# Patient Record
Sex: Female | Born: 1945
Health system: Southern US, Community
[De-identification: ages and names within clinical notes are randomized; demographics above are authoritative.]

## PROBLEM LIST (undated history)

## (undated) DIAGNOSIS — H269 Unspecified cataract: Secondary | ICD-10-CM

## (undated) DIAGNOSIS — T7840XA Allergy, unspecified, initial encounter: Secondary | ICD-10-CM

## (undated) DIAGNOSIS — K5730 Diverticulosis of large intestine without perforation or abscess without bleeding: Secondary | ICD-10-CM

## (undated) DIAGNOSIS — L309 Dermatitis, unspecified: Secondary | ICD-10-CM

## (undated) DIAGNOSIS — E739 Lactose intolerance, unspecified: Secondary | ICD-10-CM

## (undated) DIAGNOSIS — D649 Anemia, unspecified: Secondary | ICD-10-CM

## (undated) DIAGNOSIS — I10 Essential (primary) hypertension: Secondary | ICD-10-CM

## (undated) DIAGNOSIS — J189 Pneumonia, unspecified organism: Secondary | ICD-10-CM

## (undated) DIAGNOSIS — M1990 Unspecified osteoarthritis, unspecified site: Secondary | ICD-10-CM

## (undated) HISTORY — PX: VAGINAL HYSTERECTOMY: SUR661

## (undated) HISTORY — DX: Unspecified cataract: H26.9

## (undated) HISTORY — DX: Dermatitis, unspecified: L30.9

## (undated) HISTORY — DX: Allergy, unspecified, initial encounter: T78.40XA

## (undated) HISTORY — DX: Unspecified osteoarthritis, unspecified site: M19.90

## (undated) HISTORY — PX: COLONOSCOPY: SHX174

## (undated) HISTORY — DX: Diverticulosis of large intestine without perforation or abscess without bleeding: K57.30

## (undated) HISTORY — DX: Lactose intolerance, unspecified: E73.9

## (undated) HISTORY — PX: CARPAL TUNNEL RELEASE: SHX101

## (undated) HISTORY — PX: OTHER SURGICAL HISTORY: SHX169

---

## 2003-02-04 DIAGNOSIS — K5730 Diverticulosis of large intestine without perforation or abscess without bleeding: Secondary | ICD-10-CM

## 2003-02-04 HISTORY — DX: Diverticulosis of large intestine without perforation or abscess without bleeding: K57.30

## 2007-12-01 LAB — HM PAP SMEAR: HM Pap smear: NORMAL

## 2007-12-23 LAB — FECAL OCCULT BLOOD, GUAIAC: FECAL OCCULT BLD: NEGATIVE

## 2010-07-20 ENCOUNTER — Emergency Department (HOSPITAL_COMMUNITY)
Admission: EM | Admit: 2010-07-20 | Discharge: 2010-07-21 | Disposition: A | Discharge status: Home / Self Care | Payer: Medicare Other | Attending: Emergency Medicine | Admitting: Emergency Medicine

## 2010-07-20 DIAGNOSIS — M549 Dorsalgia, unspecified: Secondary | ICD-10-CM | POA: Insufficient documentation

## 2010-07-20 DIAGNOSIS — H3320 Serous retinal detachment, unspecified eye: Secondary | ICD-10-CM | POA: Insufficient documentation

## 2010-07-20 DIAGNOSIS — R079 Chest pain, unspecified: Secondary | ICD-10-CM | POA: Insufficient documentation

## 2010-07-20 DIAGNOSIS — R51 Headache: Secondary | ICD-10-CM | POA: Insufficient documentation

## 2010-07-20 DIAGNOSIS — H43399 Other vitreous opacities, unspecified eye: Secondary | ICD-10-CM | POA: Insufficient documentation

## 2010-07-20 DIAGNOSIS — R509 Fever, unspecified: Secondary | ICD-10-CM | POA: Insufficient documentation

## 2010-07-20 LAB — CBC
MCHC: 33.3 g/dL (ref 30.0–36.0)
Platelets: 182 10*3/uL (ref 150–400)
RDW: 14.6 % (ref 11.5–15.5)
WBC: 7.8 10*3/uL (ref 4.0–10.5)

## 2010-07-20 LAB — DIFFERENTIAL
Basophils Absolute: 0 10*3/uL (ref 0.0–0.1)
Basophils Relative: 0 % (ref 0–1)
Eosinophils Absolute: 0 10*3/uL (ref 0.0–0.7)
Eosinophils Relative: 0 % (ref 0–5)
Lymphocytes Relative: 26 % (ref 12–46)
Monocytes Absolute: 0.5 10*3/uL (ref 0.1–1.0)

## 2010-07-23 ENCOUNTER — Emergency Department (HOSPITAL_COMMUNITY)
Admission: EM | Admit: 2010-07-23 | Discharge: 2010-07-23 | Disposition: A | Discharge status: Home / Self Care | Payer: Medicare Other | Attending: Emergency Medicine | Admitting: Emergency Medicine

## 2010-07-23 DIAGNOSIS — E860 Dehydration: Secondary | ICD-10-CM | POA: Insufficient documentation

## 2010-07-23 DIAGNOSIS — R112 Nausea with vomiting, unspecified: Secondary | ICD-10-CM | POA: Insufficient documentation

## 2010-07-23 DIAGNOSIS — R51 Headache: Secondary | ICD-10-CM | POA: Insufficient documentation

## 2010-07-23 LAB — URINALYSIS, ROUTINE W REFLEX MICROSCOPIC
Bilirubin Urine: NEGATIVE
Hgb urine dipstick: NEGATIVE
Leukocytes, UA: NEGATIVE
Nitrite: NEGATIVE
Nitrite: NEGATIVE
Protein, ur: NEGATIVE mg/dL
Specific Gravity, Urine: 1.017 (ref 1.005–1.030)
Specific Gravity, Urine: 1.012 (ref 1.005–1.030)
Urobilinogen, UA: 1 mg/dL (ref 0.0–1.0)
Urobilinogen, UA: 0.2 mg/dL (ref 0.0–1.0)
pH: 6.5 (ref 5.0–8.0)

## 2010-07-23 LAB — POCT I-STAT, CHEM 8
Chloride: 102 mEq/L (ref 96–112)
HCT: 38 % (ref 36.0–46.0)
Hemoglobin: 12.9 g/dL (ref 12.0–15.0)
Potassium: 3.7 mEq/L (ref 3.5–5.1)
Sodium: 137 mEq/L (ref 135–145)

## 2010-07-24 LAB — B. BURGDORFI ANTIBODIES: B burgdorferi Ab IgG+IgM: 0.18 {ISR}

## 2010-07-24 LAB — ROCKY MTN SPOTTED FVR AB, IGM-BLOOD: RMSF IgM: 0.44 IV (ref 0.00–0.89)

## 2010-08-01 ENCOUNTER — Other Ambulatory Visit: Payer: Self-pay | Admitting: Internal Medicine

## 2010-08-01 ENCOUNTER — Other Ambulatory Visit: Payer: Self-pay | Admitting: Nurse Practitioner

## 2010-08-01 DIAGNOSIS — N63 Unspecified lump in unspecified breast: Secondary | ICD-10-CM

## 2010-08-09 ENCOUNTER — Ambulatory Visit
Admission: RE | Admit: 2010-08-09 | Discharge: 2010-08-09 | Disposition: A | Discharge status: Home / Self Care | Payer: Medicare Other | Source: Ambulatory Visit | Attending: Internal Medicine | Admitting: Internal Medicine

## 2010-08-09 DIAGNOSIS — N63 Unspecified lump in unspecified breast: Secondary | ICD-10-CM

## 2011-02-05 DIAGNOSIS — N760 Acute vaginitis: Secondary | ICD-10-CM | POA: Diagnosis not present

## 2011-06-16 ENCOUNTER — Emergency Department (INDEPENDENT_AMBULATORY_CARE_PROVIDER_SITE_OTHER): Payer: Medicare Other

## 2011-06-16 ENCOUNTER — Encounter (HOSPITAL_COMMUNITY): Payer: Self-pay | Admitting: Emergency Medicine

## 2011-06-16 ENCOUNTER — Emergency Department (INDEPENDENT_AMBULATORY_CARE_PROVIDER_SITE_OTHER)
Admission: EM | Admit: 2011-06-16 | Discharge: 2011-06-16 | Disposition: A | Discharge status: Home / Self Care | Payer: Medicare Other | Source: Home / Self Care | Attending: Family Medicine | Admitting: Family Medicine

## 2011-06-16 DIAGNOSIS — M542 Cervicalgia: Secondary | ICD-10-CM | POA: Diagnosis not present

## 2011-06-16 DIAGNOSIS — R209 Unspecified disturbances of skin sensation: Secondary | ICD-10-CM | POA: Diagnosis not present

## 2011-06-16 DIAGNOSIS — M5030 Other cervical disc degeneration, unspecified cervical region: Secondary | ICD-10-CM | POA: Diagnosis not present

## 2011-06-16 NOTE — ED Provider Notes (Signed)
History     CSN: [id number]  Arrival date & time 06/16/11  1520   First MD Initiated Contact with Patient 06/16/11 1721      Chief Complaint  Patient presents with  . Numbness    (Consider location/radiation/quality/duration/timing/severity/associated sxs/prior treatment) Patient is a 66 y.o. female presenting with neck injury. The history is provided by the patient. No language interpreter was used.  Neck Injury This is a chronic problem. The current episode started more than 1 week ago. The problem occurs hourly. The problem has been gradually worsening. The symptoms are relieved by nothing. She has tried nothing for the symptoms.   Pt reports her right hand is numb and she is scheduled for hand surgery.  Pt reports she has pain in her neck.   Pt called MD who told her to come here.  Pt concerned her hand numbness is from her neck. History reviewed. No pertinent past medical history.  Past Surgical History  Procedure Date  . Abdominal hysterectomy     No family history on file.  History  Substance Use Topics  . Smoking status: Never Smoker   . Smokeless tobacco: Not on file  . Alcohol Use: No    OB History    Grav Para Term Preterm Abortions TAB SAB Ect Mult Living                  Review of Systems  Musculoskeletal: Positive for myalgias.  Neurological: Positive for numbness.  All other systems reviewed and are negative.    Allergies  Latex  Home Medications   Current Outpatient Rx  Name Route Sig Dispense Refill  . ESTRADIOL 0.5 MG PO TABS Oral Take 0.5 mg by mouth daily.      BP 145/72  Pulse 62  Temp(Src) 98.6 F (37 C) (Oral)  Resp 20  SpO2 99%  Physical Exam  Nursing note and vitals reviewed. Constitutional: She is oriented to person, place, and time. She appears well-developed and well-nourished.  HENT:  Head: Normocephalic and atraumatic.  Eyes: Pupils are equal, round, and reactive to light.  Neck: Normal range of motion. Neck supple.   Pulmonary/Chest: Effort normal.  Musculoskeletal:       Tender cervical spine,  Decreased range of motion    Neurological: She is alert and oriented to person, place, and time.  Skin: Skin is warm and dry.  Psychiatric: She has a normal mood and affect.    ED Course  Procedures (including critical care time)  Labs Reviewed - No data to display Dg Cervical Spine Complete  06/16/2011  *RADIOLOGY REPORT*  Clinical Data: Chronic neck pain and right arm/hand numbness for 1 year.  CERVICAL SPINE - COMPLETE 4+ VIEW  Comparison: None.  Findings: 2 mm of anterolisthesis of C4 on C5 is present.  There is degenerative disc space narrowing noted at the C4-5 and C5-6 levels.  There are no fractures.  There is  right C4-5 and ( to a lesser extent ) C5-6 bony foraminal narrowing present.  There are no destructive changes.  The C1-C2 articulation has a normal appearance.  IMPRESSION:  1.  2 mm of anterolisthesis of C4 on C5 most likely due to facet joint arthritic change. 2.  C4-5 and C5-6 degenerative disc space narrowing. 3.  C4-5 and C5-6 bony foraminal narrowing (greater at the C4-5 level).  Original Report Authenticated By: Rolla Plate, M.D.     No diagnosis found.    MDM  Pt advised to follow up  with her Md for recheck and schedule to see Neurosurgery for evaluation        Elson Areas, [place] 06/16/11 1805

## 2011-06-16 NOTE — Discharge Instructions (Signed)
Spondylolysis with Rehab Spondylolysis is a condition of the spine that is characterized by stress fracture of one or more vertebrae. These stress fractures occur in an area of the vertebrae between the facet joints (pons inter-articularis). Facet joints are what enable the spine to move and pivot.  SYMPTOMS   Dull, achy pain in the lower back. Pain that worsens with extension of the spine.   Tightness of the muscles on the back of the thigh.   Lower back stiffness.  CAUSES  Stress fractures occur when repetitive stress damages a bone faster than the bone can heal. Common mechanisms of injury for spondylolysis include:  Repetitive extension beyond normal limits of the spine (hyperextension).   Repetitive excessive rotation of the spine.   Direct trauma (uncommon).  RISK INCREASES WITH:  Activities that have a risk of hyper-extending the back.   Activities that have a risk of excessively rotating the spine.   Poor strength and flexibility   Failure to warm-up properly before activity.   Family history of spondylolysis or slippage or movement of one vertebra on another (spondylolisthesis).   Improper sports technique.  PREVENTION  Use proper technique.   Wear proper protective equipment and ensure correct fit.   Appropriately warm up and stretch before practice or competition.   Maintain appropriate conditioning:   Back and hamstring flexibility.   Back muscle strength and endurance.   Cardiovascular fitness.  PROGNOSIS  If treated properly, then spondylolysis usually heals within 6 weeks of non-surgical (conservative) treatment. RELATED COMPLICATIONS   Recurrent symptoms that result in a chronic problem.   Inability to compete in athletics.   Prolonged healing time, if improperly treated or re-injured.   Failure of the fracture to heal (nonunion)   Healing of the fracture in a poor position (malunion).   Progression to spondylolisthesis.  TREATMENT    Treatment initially involves resting from any activities that aggravate the symptoms, and the use of ice and medications to help reduce pain and inflammation. The use of strengthening and stretching exercises may help reduce pain with activity. These exercises may be performed at home or with referral to a therapist. It is important to learn how to use proper body mechanics so undue stress is not placed on your spine. If the injury is severe, then your caregiver may recommend a back brace to allow for healing or even surgery. Surgery often involves fusing two adjacent vertebrae, so no movement is allowed between them. Surgery is rarely performed for this condition; however, if symptoms persist for greater than 12 months despite non-surgical (conservative) treatment, then surgery may be recommended. MEDICATION   If pain medication is necessary, then nonsteroidal anti-inflammatory medications, such as aspirin and ibuprofen, or other minor pain relievers, such as acetaminophen, are often recommended.   Do not take pain medication for 7 days before surgery.   Prescription pain relievers may be given if deemed necessary by your caregiver. Use only as directed and only as much as you need.  HEAT AND COLD:  Cold treatment (icing) relieves pain and reduces inflammation. Cold treatment should be applied for 10 to 15 minutes every 2 to 3 hours for inflammation and pain and immediately after any activity that aggravates your symptoms. Use ice packs or massage the area with a piece of ice (ice massage).   Heat treatment may be used prior to performing the stretching and strengthening activities prescribed by your caregiver, physical therapist, or athletic trainer. Use a heat pack or soak your injury in   warm water.  SEEK MEDICAL CARE IF:  Treatment seems to offer no benefit, or the condition worsens.   Any medications produce adverse side effects.  EXERCISES RANGE OF MOTION (ROM) AND STRETCHING EXERCISES -  Spondylolysis Most people with low back pain will find that their symptoms worsen with either excessive bending forward (flexion) or arching at the low back (extension). The exercises which will help resolve your symptoms will focus on the opposite motion. Your physician, physical therapist or athletic trainer will help you determine which exercises will be most helpful to resolve your low back pain. Do not complete any exercises without first consulting with your clinician. Discontinue any exercises which worsen your symptoms until you speak to your clinician. You may have pain, numbness or tingling which travels down into your buttocks, leg or foot.  The goal of the therapy is to get these symptoms to recede to your back and eventually resolve. Occasionally, these leg symptoms will get better, but your low back pain may worsen; this is typically an indication of progress in your rehabilitation. Be certain to be very alert to any changes in your symptoms and the activities in which you participated in the 24 hours prior to the change. Sharing this information with your clinician will allow him/her to most efficiently treat your condition. These exercises may help you when beginning to rehabilitate your injury. Your symptoms may resolve with or without further involvement from your physician, physical therapist or athletic trainer. While completing these exercises, remember:   Restoring tissue flexibility helps normal motion to return to the joints. This allows healthier, less painful movement and activity.   An effective stretch should be held for at least 30 seconds.   A stretch should never be painful. You should only feel a gentle lengthening or release in the stretched tissue.  FLEXION RANGE OF MOTION AND STRETCHING EXERCISES: STRETCH - Flexion, Single Knee to Chest  Lie on a firm bed or the floor with both legs extended in front of you.   Keeping one leg in contact with the floor, bring your  opposite knee to your chest. Hold your leg in place by either grabbing behind your thigh or at your knee.   Pull until you feel a gentle stretch in your low back. Hold __________ seconds.   Slowly release your grasp and repeat the exercise with the opposite side.  Repeat __________ times. Complete this exercise __________ times per day.  STRETCH - Flexion, Double Knee to Chest  Lie on a firm bed or the floor with both legs extended in front of you.   Keeping one leg in contact with the floor, bring your opposite knee to your chest.   Tense your stomach muscles to support your back and then lift your other knee to your chest. Hold your legs in place by either grabbing behind your thighs or at your knees.   Pull both knees toward your chest until you feel a gentle stretch in your low back. Hold __________ seconds.   Tense your stomach muscles and slowly return one leg at a time to the floor.  Repeat __________ times. Complete this exercise __________ times per day.  STRENGTHENING EXERCISES - Spondylolysis These exercises may help you when beginning to rehabilitate your injury. These exercises should be done near your "sweet spot." This is the neutral, low-back arch, somewhere between fully rounded and fully arched, that is your least painful position. When performed in this safe range of motion, these exercises can be   used for people who have either a flexion or extension based injury. These exercises may resolve your symptoms with or without further involvement from your physician, physical therapist or athletic trainer. While completing these exercises, remember:   Muscles can gain both the endurance and the strength needed for everyday activities through controlled exercises.   Complete these exercises as instructed by your physician, physical therapist or athletic trainer. Progress with the resistance and repetition exercises only as your caregiver advises.   You may experience muscle  soreness or fatigue, but the pain or discomfort you are trying to eliminate should never worsen during these exercises. If this pain does worsen, stop and make certain you are following the directions exactly. If the pain is still present after adjustments, discontinue the exercise until you can discuss the trouble with your clinician.  STRENGTHENING - Deep Abdominals, Pelvic Tilt   Lie on a firm bed or the floor. Keeping your legs in front of you, bend your knees so they are both pointed toward the ceiling and your feet are flat on the floor.   Tense your lower abdominal muscles to press your low back into the floor. This motion will rotate your pelvis so that your tail bone is scooping upwards rather than pointing at your feet or into the floor.   With a gentle tension and even breathing, hold this position for __________ seconds.  Repeat __________ times. Complete this exercise __________ times per day.  STRENGTHENING - Abdominals, Crunches  Lie on a firm bed or the floor. Keeping your legs in front of you, bend your knees so they are both pointed toward the ceiling and your feet are flat on the floor. Cross your arms over your chest.   Slightly tip your chin down without bending your neck.   Tense your abdominals and slowly lift your trunk high enough to just clear your shoulder blades. Lifting higher can put excessive stress on the low back and does not further strengthen your abdominal muscles.   Control your return to the starting position.  Repeat __________ times. Complete this exercise __________ times per day.  STRENGTHENING - Quadruped, Opposite UE/LE Lift  Assume a hands and knees position on a firm surface. Keep your hands under your shoulders and your knees under your hips. You may place padding under your knees for comfort.   Find your neutral spine and gently tense your abdominal muscles so that you can maintain this position. Your shoulders and hips should form a rectangle  that is parallel with the floor and is not twisted.   Keeping your trunk steady, lift your right hand no higher than your shoulder and then your left leg no higher than your hip. Make sure you are not holding your breath. Hold this position __________ seconds.   Continuing to keep your abdominal muscles tense and your back steady, slowly return to your starting position. Repeat with the opposite arm and leg.  Repeat __________ times. Complete this exercise __________ times per day.  STRENGTHENING - Lower Abdominals, Double Knee Lift  Lie on a firm bed or the floor. Keeping your legs in front of you, bend your knees so they are both pointed toward the ceiling and your feet are flat on the floor.   Tense your abdominal muscles to brace your low back and slowly lift both of your knees until they come over your hips. Be certain not to hold your breath.   Hold __________ seconds. Using your abdominal muscles, return to   the starting position in a slow and controlled manner.  Repeat __________ times. Complete this exercise __________ times per day.  POSTURE AND BODY MECHANICS CONSIDERATIONS - Spondylolysis Keeping correct posture when sitting, standing or completing your activities will reduce the stress put on different body tissues, allowing injured tissues a chance to heal and limiting painful experiences. The following are general guidelines for improved posture. Your physician or physical therapist will provide you with any instructions specific to your needs. While reading these guidelines, remember:  The exercises prescribed by your provider will help you have the flexibility and strength to maintain correct postures.   The correct posture provides the optimal environment for your joints to work. All of your joints have less wear and tear when properly supported by a spine with good posture. This means you will experience a healthier, less painful body.   Correct posture must be practiced with  all of your activities, especially prolonged sitting and standing. Correct posture is as important when doing repetitive low-stress activities (typing) as it is when doing a single heavy-load activity (lifting).  PROPER SITTING POSTURE In order to minimize stress and discomfort on your spine, you must sit with correct posture. Sitting with good posture should be effortless for a healthy body. Returning to good posture is a gradual process. Many people can work toward this most comfortably by using various supports until they have the flexibility and strength to maintain this posture on their own. When sitting with proper posture, your ears will fall over your shoulders and your shoulders will fall over your hips. You should use the back of the chair to support your upper back. Your low back will be in a neutral position, just slightly arched. You may place a small pillow or folded towel at the base of your low back for support.  When working at a desk, create an environment that supports good, upright posture. Without extra support, muscles fatigue and lead to excessive strain on joints and other tissues. Keep these recommendations in mind: CHAIR:  A chair should be able to slide under your desk when your back makes contact with the back of the chair. This allows you to work closely.   The chair's height should allow your eyes to be level with the upper part of your monitor and your hands to be slightly lower than your elbows.  BODY POSITION  Your feet should make contact with the floor. If this is not possible, use a foot rest.   Keep your ears over your shoulders. This will reduce stress on your neck and low back.  INCORRECTSITTING POSTURES If you are feeling tired and unable to assume a healthy sitting posture, do not slouch or slump. This puts excessive strain on your back tissues, causing more damage and pain. Healthier options include:  Using more support, like a lumbar pillow.   Switching  tasks to something that requires you to be upright or walking.   Talking a brief walk.   Lying down to rest in a neutral-spine position.  CORRECT LIFTING TECHNIQUES DO :   Assume a wide stance. This will provide you more stability and the opportunity to get as close as possible to the object which you are lifting.   Tense your abdominals to brace your spine; then bend at the knees and hips. Keeping your back locked in a neutral-spine position, lift using your leg muscles. Lift with your legs, keeping your back straight.   Test the weight of unknown objects   before attempting to lift them.   Try to keep your elbows locked down at your sides in order get the best strength from your shoulders when carrying an object.   Always ask for help when lifting heavy or awkward objects.  INCORRECT LIFTING TECHNIQUES DO NOT:   Lock your knees when lifting, even if it is a small object.   Bend and twist. Pivot at your feet or move your feet when needing to change directions.   Assume that you cannot safely pick up a paperclip without proper posture.  Document Released: 01/20/2005 Document Revised: 01/09/2011 Document Reviewed: 05/04/2008 [employer] Patient Information 2012 [employer]. 

## 2011-06-16 NOTE — ED Notes (Signed)
Patient reports planning to have surgery on hand.  Patient has been told by a friend that this hand numbness may be her neck.  Patient here for neck xray.  Patient reports dr Mina Marble is hand specialist she is seeing.  Patient reports right hand numb.

## 2011-06-17 NOTE — ED Provider Notes (Signed)
Medical screening examination/treatment/procedure(s) were performed by non-physician practitioner and as supervising physician I was immediately available for consultation/collaboration.   MORENO-COLL,Anhelica Fowers; MD   Shalon Salado Moreno-Coll, MD 06/17/11 0115 

## 2011-06-19 DIAGNOSIS — M159 Polyosteoarthritis, unspecified: Secondary | ICD-10-CM | POA: Diagnosis not present

## 2011-06-19 DIAGNOSIS — G5600 Carpal tunnel syndrome, unspecified upper limb: Secondary | ICD-10-CM | POA: Diagnosis not present

## 2011-06-20 DIAGNOSIS — M5030 Other cervical disc degeneration, unspecified cervical region: Secondary | ICD-10-CM | POA: Diagnosis not present

## 2011-06-23 DIAGNOSIS — G5600 Carpal tunnel syndrome, unspecified upper limb: Secondary | ICD-10-CM | POA: Diagnosis not present

## 2011-06-23 DIAGNOSIS — M19049 Primary osteoarthritis, unspecified hand: Secondary | ICD-10-CM | POA: Diagnosis not present

## 2011-06-23 DIAGNOSIS — M654 Radial styloid tenosynovitis [de Quervain]: Secondary | ICD-10-CM | POA: Diagnosis not present

## 2011-06-24 ENCOUNTER — Other Ambulatory Visit: Payer: Self-pay | Admitting: Neurosurgery

## 2011-06-24 DIAGNOSIS — M47812 Spondylosis without myelopathy or radiculopathy, cervical region: Secondary | ICD-10-CM

## 2011-06-26 DIAGNOSIS — G5600 Carpal tunnel syndrome, unspecified upper limb: Secondary | ICD-10-CM | POA: Diagnosis not present

## 2011-06-26 DIAGNOSIS — M159 Polyosteoarthritis, unspecified: Secondary | ICD-10-CM | POA: Diagnosis not present

## 2011-06-27 DIAGNOSIS — W57XXXA Bitten or stung by nonvenomous insect and other nonvenomous arthropods, initial encounter: Secondary | ICD-10-CM | POA: Diagnosis not present

## 2011-07-03 ENCOUNTER — Ambulatory Visit
Admission: RE | Admit: 2011-07-03 | Discharge: 2011-07-03 | Disposition: A | Discharge status: Home / Self Care | Payer: Medicare Other | Source: Ambulatory Visit | Attending: Neurosurgery | Admitting: Neurosurgery

## 2011-07-03 DIAGNOSIS — M47812 Spondylosis without myelopathy or radiculopathy, cervical region: Secondary | ICD-10-CM

## 2011-07-03 DIAGNOSIS — M5030 Other cervical disc degeneration, unspecified cervical region: Secondary | ICD-10-CM | POA: Diagnosis not present

## 2011-07-10 DIAGNOSIS — G5600 Carpal tunnel syndrome, unspecified upper limb: Secondary | ICD-10-CM | POA: Diagnosis not present

## 2011-07-10 DIAGNOSIS — M19049 Primary osteoarthritis, unspecified hand: Secondary | ICD-10-CM | POA: Diagnosis not present

## 2011-07-17 ENCOUNTER — Other Ambulatory Visit: Payer: Self-pay | Admitting: Family Medicine

## 2011-07-17 DIAGNOSIS — M19049 Primary osteoarthritis, unspecified hand: Secondary | ICD-10-CM | POA: Diagnosis not present

## 2011-07-17 DIAGNOSIS — G5600 Carpal tunnel syndrome, unspecified upper limb: Secondary | ICD-10-CM | POA: Diagnosis not present

## 2011-07-17 DIAGNOSIS — Z1231 Encounter for screening mammogram for malignant neoplasm of breast: Secondary | ICD-10-CM

## 2011-07-24 DIAGNOSIS — M5030 Other cervical disc degeneration, unspecified cervical region: Secondary | ICD-10-CM | POA: Diagnosis not present

## 2011-08-12 ENCOUNTER — Ambulatory Visit
Admission: RE | Admit: 2011-08-12 | Discharge: 2011-08-12 | Disposition: A | Discharge status: Home / Self Care | Payer: Medicare Other | Source: Ambulatory Visit | Attending: Family Medicine | Admitting: Family Medicine

## 2011-08-12 DIAGNOSIS — Z1231 Encounter for screening mammogram for malignant neoplasm of breast: Secondary | ICD-10-CM

## 2011-08-26 DIAGNOSIS — M5030 Other cervical disc degeneration, unspecified cervical region: Secondary | ICD-10-CM | POA: Diagnosis not present

## 2011-08-27 DIAGNOSIS — Z0000 Encounter for general adult medical examination without abnormal findings: Secondary | ICD-10-CM | POA: Diagnosis not present

## 2011-09-01 DIAGNOSIS — B370 Candidal stomatitis: Secondary | ICD-10-CM | POA: Diagnosis not present

## 2011-09-02 DIAGNOSIS — M19049 Primary osteoarthritis, unspecified hand: Secondary | ICD-10-CM | POA: Diagnosis not present

## 2011-09-23 DIAGNOSIS — G5600 Carpal tunnel syndrome, unspecified upper limb: Secondary | ICD-10-CM | POA: Diagnosis not present

## 2011-09-23 DIAGNOSIS — M19049 Primary osteoarthritis, unspecified hand: Secondary | ICD-10-CM | POA: Diagnosis not present

## 2011-10-10 DIAGNOSIS — Z23 Encounter for immunization: Secondary | ICD-10-CM | POA: Diagnosis not present

## 2011-11-11 ENCOUNTER — Emergency Department (INDEPENDENT_AMBULATORY_CARE_PROVIDER_SITE_OTHER)
Admission: EM | Admit: 2011-11-11 | Discharge: 2011-11-11 | Disposition: A | Discharge status: Home / Self Care | Payer: Medicare Other | Source: Home / Self Care | Attending: Family Medicine | Admitting: Family Medicine

## 2011-11-11 ENCOUNTER — Encounter (HOSPITAL_COMMUNITY): Payer: Self-pay | Admitting: Emergency Medicine

## 2011-11-11 DIAGNOSIS — H109 Unspecified conjunctivitis: Secondary | ICD-10-CM

## 2011-11-11 DIAGNOSIS — J309 Allergic rhinitis, unspecified: Secondary | ICD-10-CM

## 2011-11-11 DIAGNOSIS — J302 Other seasonal allergic rhinitis: Secondary | ICD-10-CM

## 2011-11-11 MED ORDER — FLUTICASONE PROPIONATE 50 MCG/ACT NA SUSP: 1.0000 | Freq: Two times a day (BID) | NASAL | Status: DC

## 2011-11-11 MED ORDER — TOBRAMYCIN 0.3 % OP SOLN: 1.0000 [drp] | Freq: Four times a day (QID) | OPHTHALMIC | Status: DC

## 2011-11-11 MED ORDER — FEXOFENADINE HCL 180 MG PO TABS: 180.0000 mg | ORAL_TABLET | Freq: Every day | ORAL | Status: DC

## 2011-11-11 NOTE — ED Notes (Signed)
Onset Saturday of symptoms: cough, congestion, loss of voice, eyes with drainage per patient .

## 2011-11-11 NOTE — ED Provider Notes (Signed)
History     CSN: [id number]  Arrival date & time 11/11/11  0807   First MD Initiated Contact with Patient 11/11/11 [phone]      Chief Complaint  Patient presents with  . URI    (Consider location/radiation/quality/duration/timing/severity/associated sxs/prior treatment) Patient is a 66 y.o. female presenting with URI. The history is provided by the patient.  URI The primary symptoms include cough. Primary symptoms do not include fever, sore throat, swollen glands, nausea, vomiting, myalgias or rash. The current episode started 3 to 5 days ago. This is a new problem. The problem has been gradually worsening.  Symptoms associated with the illness include congestion and rhinorrhea. The illness is not associated with chills.    History reviewed. No pertinent past medical history.  Past Surgical History  Procedure Date  . Abdominal hysterectomy     No family history on file.  History  Substance Use Topics  . Smoking status: Never Smoker   . Smokeless tobacco: Not on file  . Alcohol Use: No    OB History    Grav Para Term Preterm Abortions TAB SAB Ect Mult Living                  Review of Systems  Constitutional: Negative for fever and chills.  HENT: Positive for congestion and rhinorrhea. Negative for sore throat.   Respiratory: Positive for cough.   Cardiovascular: Negative.   Gastrointestinal: Negative.  Negative for nausea and vomiting.  Musculoskeletal: Negative for myalgias.  Skin: Negative for rash.    Allergies  Latex  Home Medications   Current Outpatient Rx  Name Route Sig Dispense Refill  . GUAIFENESIN 100 MG/5ML PO LIQD Oral Take 200 mg by mouth 3 (three) times daily as needed.    . IBUPROFEN 100 MG PO TABS Oral Take 100 mg by mouth every 6 (six) hours as needed.    Marland Kitchen OXYMETAZOLINE HCL 0.05 % NA SOLN Nasal Place 2 sprays into the nose 2 (two) times daily.    Marland Kitchen ESTRADIOL 0.5 MG PO TABS Oral Take 0.5 mg by mouth daily.    Marland Kitchen FEXOFENADINE HCL 180 MG PO  TABS Oral Take 1 tablet (180 mg total) by mouth daily. 30 tablet 1  . FLUTICASONE PROPIONATE 50 MCG/ACT NA SUSP Nasal Place 1 spray into the nose 2 (two) times daily. 1 g 2  . TOBRAMYCIN SULFATE 0.3 % OP SOLN Both Eyes Place 1 drop into both eyes every 6 (six) hours. 5 mL 0    BP 117/71  Pulse 92  Temp 98.1 F (36.7 C) (Oral)  Resp 20  SpO2 98%  Physical Exam  Nursing note and vitals reviewed. Constitutional: She is oriented to person, place, and time. She appears well-developed and well-nourished.  HENT:  Head: Normocephalic.  Right Ear: External ear normal.  Left Ear: External ear normal.  Mouth/Throat: Oropharynx is clear and moist.  Eyes: EOM and lids are normal. Pupils are equal, round, and reactive to light. Right conjunctiva is injected. Left conjunctiva is injected.  Neck: Normal range of motion. Neck supple.  Cardiovascular: Normal rate, regular rhythm, normal heart sounds and intact distal pulses.   Pulmonary/Chest: Effort normal and breath sounds normal.  Neurological: She is alert and oriented to person, place, and time.  Skin: Skin is warm and dry.    ED Course  Procedures (including critical care time)  Labs Reviewed - No data to display No results found.   1. Seasonal allergic rhinitis   2. Conjunctivitis  MDM          Linna Hoff, MD 11/11/11 [phone]

## 2011-11-12 DIAGNOSIS — J069 Acute upper respiratory infection, unspecified: Secondary | ICD-10-CM | POA: Diagnosis not present

## 2011-12-17 DIAGNOSIS — L608 Other nail disorders: Secondary | ICD-10-CM | POA: Diagnosis not present

## 2011-12-17 DIAGNOSIS — L259 Unspecified contact dermatitis, unspecified cause: Secondary | ICD-10-CM | POA: Diagnosis not present

## 2012-01-14 DIAGNOSIS — Z0000 Encounter for general adult medical examination without abnormal findings: Secondary | ICD-10-CM | POA: Diagnosis not present

## 2012-06-01 ENCOUNTER — Ambulatory Visit (INDEPENDENT_AMBULATORY_CARE_PROVIDER_SITE_OTHER): Payer: Medicare Other | Admitting: Family Medicine

## 2012-06-01 ENCOUNTER — Encounter: Payer: Self-pay | Admitting: Family Medicine

## 2012-06-01 VITALS — BP 110/78 | HR 70 | Temp 98.2°F | Resp 16 | Wt 147.5 lb

## 2012-06-01 DIAGNOSIS — Z79890 Hormone replacement therapy (postmenopausal): Secondary | ICD-10-CM | POA: Insufficient documentation

## 2012-06-01 NOTE — Progress Notes (Signed)
  Subjective:    Patient ID: Beverly Carter, female    DOB: September 05, 1945, 67 y.o.   MRN: [id number]  HPI Patient is here to discuss replacement therapy. She underwent a complete hysterectomy in 1994. She's been on estradiol 0.5 mg tablets by mouth daily ever since. A year ago she tried to decrease it to 0.25 mg tablets. She is taking this daily without problems. She denies any hot flashes, vaginal dryness, mood swings, or fatigue.  She is concerned by it the risk of hormone replacement therapy and would like to discuss it further. Past medical history is reviewed. Current Outpatient Prescriptions on File Prior to Visit  Medication Sig Dispense Refill  . estradiol (ESTRACE) 0.5 MG tablet Take 0.5 mg by mouth daily.      . fluticasone (FLONASE) 50 MCG/ACT nasal spray Place 1 spray into the nose 2 (two) times daily.  1 g  2   No current facility-administered medications on file prior to visit.   Allergies  Allergen Reactions  . Latex    History   Social History  . Marital Status: Single    Spouse Name: N/A    Number of Children: N/A  . Years of Education: N/A   Occupational History  . Not on file.   Social History Main Topics  . Smoking status: Never Smoker   . Smokeless tobacco: Not on file  . Alcohol Use: No  . Drug Use: No  . Sexually Active:    Other Topics Concern  . Not on file   Social History Narrative  . No narrative on file      Review of Systems  All other systems reviewed and are negative.       Objective:   Physical Exam There is no physical exam today. The entire office visit was spent in discussion.       Assessment & Plan:  Postmenopausal HRT (hormone replacement therapy)  I discussed the increased risk of stroke, breast cancer, and DVT on estrogen replacement therapy. I discussed these risks were very very low. However the patient like to discontinue the medicine given her duration of therapy. I have told her to completely discontinue the  estradiol. She can try black cohosh as needed for hot flashes. She has significant vasomotor symptoms that are unrelieved by black cohosh, we can try a tapering of the estradiol to an every other day dose followed by an every three-day basis. Recommended a complete physical exam with fasting lab work at the [facility].

## 2012-06-21 ENCOUNTER — Other Ambulatory Visit: Payer: Self-pay

## 2012-06-21 DIAGNOSIS — Z1231 Encounter for screening mammogram for malignant neoplasm of breast: Secondary | ICD-10-CM

## 2012-06-24 ENCOUNTER — Ambulatory Visit (INDEPENDENT_AMBULATORY_CARE_PROVIDER_SITE_OTHER): Payer: Medicare Other | Admitting: Physician Assistant

## 2012-06-24 ENCOUNTER — Encounter: Payer: Self-pay | Admitting: Physician Assistant

## 2012-06-24 VITALS — BP 130/70 | HR 64 | Temp 97.4°F | Resp 18 | Ht 64.5 in | Wt 149.0 lb

## 2012-06-24 DIAGNOSIS — R5383 Other fatigue: Secondary | ICD-10-CM

## 2012-06-24 DIAGNOSIS — L259 Unspecified contact dermatitis, unspecified cause: Secondary | ICD-10-CM | POA: Diagnosis not present

## 2012-06-24 DIAGNOSIS — L309 Dermatitis, unspecified: Secondary | ICD-10-CM

## 2012-06-24 DIAGNOSIS — Z79890 Hormone replacement therapy (postmenopausal): Secondary | ICD-10-CM | POA: Diagnosis not present

## 2012-06-24 DIAGNOSIS — E559 Vitamin D deficiency, unspecified: Secondary | ICD-10-CM

## 2012-06-24 DIAGNOSIS — Z0000 Encounter for general adult medical examination without abnormal findings: Secondary | ICD-10-CM

## 2012-06-24 DIAGNOSIS — T7840XA Allergy, unspecified, initial encounter: Secondary | ICD-10-CM | POA: Insufficient documentation

## 2012-06-24 DIAGNOSIS — J309 Allergic rhinitis, unspecified: Secondary | ICD-10-CM

## 2012-06-24 DIAGNOSIS — E739 Lactose intolerance, unspecified: Secondary | ICD-10-CM

## 2012-06-24 DIAGNOSIS — R5381 Other malaise: Secondary | ICD-10-CM

## 2012-06-24 DIAGNOSIS — B372 Candidiasis of skin and nail: Secondary | ICD-10-CM | POA: Diagnosis not present

## 2012-06-24 DIAGNOSIS — E785 Hyperlipidemia, unspecified: Secondary | ICD-10-CM

## 2012-06-24 DIAGNOSIS — H409 Unspecified glaucoma: Secondary | ICD-10-CM

## 2012-06-24 MED ORDER — NYSTATIN-TRIAMCINOLONE 100000-0.1 UNIT/GM-% EX CREA: TOPICAL_CREAM | Freq: Four times a day (QID) | CUTANEOUS | Status: DC

## 2012-06-24 NOTE — Progress Notes (Signed)
Patient ID: Beverly Carter MRN: [id number], DOB: Aug 07, 1945, 67 y.o. Date of Encounter: @DATE @  Chief Complaint:  Chief Complaint  Patient presents with  . Annual Exam    cpe    HPI: 67 y.o. 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 She is active with yard work and does squats. This is her exercise ! No complaints. Recently saw statistics about Alzheimers and is scared of developing this-her husband had dementia-but no active complaints. No angina symptoms-even with exertion.   She deferes pelvic exam-has had complete hysterectomy. Agreeable to breast exam   Past Medical History  Diagnosis Date  . Allergy   . Eczema   . Lactose intolerance   . Glaucoma      Home Meds: See attached medication section for current medication list. Any medications entered into computer today will not appear on this note's list. The medications listed below were entered prior to today. Current Outpatient Prescriptions on File Prior to Visit  Medication Sig Dispense Refill  . desoximetasone (TOPICORT) 0.05 % cream Apply topically 2 (two) times daily.      . fluticasone (FLONASE) 50 MCG/ACT nasal spray Place 1 spray into the nose 2 (two) times daily.  1 g  2  . loratadine (CLARITIN) 10 MG tablet Take 10 mg by mouth daily.       No current facility-administered medications on file prior to visit.    Allergies:  Allergies  Allergen Reactions  . Latex     History   Social History  . Marital Status: Single    Spouse Name: N/A    Number of Children: N/A  . Years of Education: N/A   Occupational History  . Not on file.   Social History Main Topics  . Smoking status: Never Smoker   . Smokeless tobacco: Not on file  . Alcohol Use: No  . Drug Use: No  . Sexually Active: Not on file   Other Topics Concern  . Not on file   Social History Narrative  . No narrative on file     Family History  Problem Relation Age of Onset  . Heart disease Mother 72    MI     Review of Systems:  See HPI for pertinent ROS. All other ROS negative.    Physical Exam: Blood pressure 130/70, pulse 64, temperature 97.4 F (36.3 C), temperature source Oral, resp. rate 18, height 5' 4.5" (1.638 m), weight 149 lb (67.586 kg)., Body mass index is 25.19 kg/(m^2). General:WNWD WF Appears in no acute distress. Head: Normocephalic, atraumatic, eyes without discharge, sclera non-icteric, nares are without discharge. Bilateral auditory canals clear, TM's are without perforation, pearly grey and translucent with reflective cone of light bilaterally. Oral cavity moist, posterior pharynx without exudate, erythema, peritonsillar abscess, or post nasal drip.  Neck: Supple. No thyromegaly. No lymphadenopathy. Lungs: Clear bilaterally to auscultation without wheezes, rales, or rhonchi. Breathing is unlabored. Heart: RRR with S1 S2. No murmurs, rubs, or gallops. Breasts: normal. No masses.  Abdomen: Soft, non-tender, non-distended with normoactive bowel sounds. No hepatomegaly. No rebound/guarding. No obvious abdominal masses. Musculoskeletal:  Strength and tone normal for age. Extremities/Skin: Warm and dry. No clubbing or cyanosis. No edema. No rashes or suspicious lesions. Neuro: Alert and oriented X 3. Moves all extremities spontaneously. Gait is normal. CNII-XII grossly in tact. Psych:  Responds to questions appropriately with a normal  affect.     ASSESSMENT AND PLAN:  67 y.o. year old female with  1. Visit for preventive health examination - CBC with Differential; Future - COMPLETE METABOLIC PANEL WITH GFR; Future - Lipid panel; Future - TSH; Future - Vitamin D 25 hydroxy; Future - DG Bone Density; Future  2. Postmenopausal HRT (hormone replacement therapy) She has stopped HRT. Has no/minimal hotflashes.  Last DEXA was in [place] in 2009-nml per pt. Will repeat now. H/O complete  hysterectomy/oophorectomy-now off HRT - DG Bone Density; Future  3. Eczema - nystatin-triamcinolone (MYCOLOG II) cream; Apply topically 4 (four) times daily.  Dispense: 30 g; Refill: 1  4. Cutaneous candidiasis - nystatin-triamcinolone (MYCOLOG II) cream; Apply topically 4 (four) times daily.  Dispense: 30 g; Refill: 1  5. Allergic rhinitis On Claritan, Flonase  6. Glaucoma Sees [facility] Routinely  7. Lactose intolerance  8. Other malaise and fatigue - CBC with Differential; Future - COMPLETE METABOLIC PANEL WITH GFR; Future - Lipid panel; Future - TSH; Future  9. Other and unspecified hyperlipidemia - Lipid panel; Future  10. Unspecified vitamin D deficiency - Vitamin D 25 hydroxy; Future - DG Bone Density; Future  11. Mammogram: Last-July 2013-nml-Pt plans to repeat in July 12. Screening Colonoscopy: Had at age 86 and at age 67. -Nml 60. Immunizations:   Tetanus: Had with [facility] 2010  Zostavax: Had here 10/11/2010 14 Screening Labs: She is not fasting. She will RTC fasting in am to check labs.    Signed, [address] [place], [place], [facility] 06/24/2012 9:15 AM

## 2012-06-25 ENCOUNTER — Other Ambulatory Visit (INDEPENDENT_AMBULATORY_CARE_PROVIDER_SITE_OTHER): Payer: Medicare Other

## 2012-06-25 DIAGNOSIS — E785 Hyperlipidemia, unspecified: Secondary | ICD-10-CM

## 2012-06-25 DIAGNOSIS — E559 Vitamin D deficiency, unspecified: Secondary | ICD-10-CM | POA: Diagnosis not present

## 2012-06-25 DIAGNOSIS — Z0000 Encounter for general adult medical examination without abnormal findings: Secondary | ICD-10-CM | POA: Diagnosis not present

## 2012-06-25 DIAGNOSIS — R5381 Other malaise: Secondary | ICD-10-CM | POA: Diagnosis not present

## 2012-06-25 DIAGNOSIS — R5383 Other fatigue: Secondary | ICD-10-CM

## 2012-06-25 LAB — COMPLETE METABOLIC PANEL WITH GFR
Alkaline Phosphatase: 79 U/L (ref 39–117)
BUN: 13 mg/dL (ref 6–23)
CO2: 28 mEq/L (ref 19–32)
Creat: 0.94 mg/dL (ref 0.50–1.10)
GFR, Est African American: 72 mL/min
GFR, Est Non African American: 63 mL/min
Glucose, Bld: 96 mg/dL (ref 70–99)
Total Bilirubin: 0.5 mg/dL (ref 0.3–1.2)

## 2012-06-25 LAB — CBC WITH DIFFERENTIAL/PLATELET
Basophils Relative: 0 % (ref 0–1)
Eosinophils Absolute: 0.1 10*3/uL (ref 0.0–0.7)
Eosinophils Relative: 2 % (ref 0–5)
Hemoglobin: 14.4 g/dL (ref 12.0–15.0)
Lymphs Abs: 1.5 10*3/uL (ref 0.7–4.0)
MCH: 29.4 pg (ref 26.0–34.0)
MCHC: 35.3 g/dL (ref 30.0–36.0)
MCV: 83.3 fL (ref 78.0–100.0)
Monocytes Relative: 7 % (ref 3–12)
Neutrophils Relative %: 61 % (ref 43–77)
RBC: 4.9 MIL/uL (ref 3.87–5.11)

## 2012-06-25 LAB — TSH: TSH: 1.615 u[IU]/mL (ref 0.350–4.500)

## 2012-06-25 LAB — LIPID PANEL
HDL: 54 mg/dL (ref >39)
LDL Cholesterol: 193 mg/dL — ABNORMAL HIGH (ref 0–99)

## 2012-06-26 LAB — VITAMIN D 25 HYDROXY (VIT D DEFICIENCY, FRACTURES): Vit D, 25-Hydroxy: 50 ng/mL (ref 30–89)

## 2012-06-26 MED ORDER — SIMVASTATIN 20 MG PO TABS: 20.0000 mg | ORAL_TABLET | Freq: Every day | ORAL | Status: DC

## 2012-07-08 ENCOUNTER — Other Ambulatory Visit: Payer: Self-pay | Admitting: Family Medicine

## 2012-07-08 DIAGNOSIS — Z780 Asymptomatic menopausal state: Secondary | ICD-10-CM

## 2012-07-08 DIAGNOSIS — Z79890 Hormone replacement therapy (postmenopausal): Secondary | ICD-10-CM

## 2012-07-08 DIAGNOSIS — E559 Vitamin D deficiency, unspecified: Secondary | ICD-10-CM

## 2012-07-08 DIAGNOSIS — E8940 Asymptomatic postprocedural ovarian failure: Secondary | ICD-10-CM

## 2012-07-16 ENCOUNTER — Other Ambulatory Visit: Payer: Self-pay | Admitting: Family Medicine

## 2012-07-16 DIAGNOSIS — M810 Age-related osteoporosis without current pathological fracture: Secondary | ICD-10-CM

## 2012-07-16 DIAGNOSIS — E2839 Other primary ovarian failure: Secondary | ICD-10-CM

## 2012-08-11 ENCOUNTER — Telehealth: Payer: Self-pay | Admitting: Physician Assistant

## 2012-08-11 DIAGNOSIS — Z79899 Other long term (current) drug therapy: Secondary | ICD-10-CM

## 2012-08-11 DIAGNOSIS — E785 Hyperlipidemia, unspecified: Secondary | ICD-10-CM

## 2012-08-11 NOTE — Telephone Encounter (Signed)
Patient had questions about cholesterol meds.  States no one explained to her when called with labs results.  I answered all of her questions.  She made appt for Friday to have 6 weeks f/u labs done.  States then would like to discuss whether she still needs to take or can make dietary modifications to control.

## 2012-08-12 ENCOUNTER — Ambulatory Visit
Admission: RE | Admit: 2012-08-12 | Discharge: 2012-08-12 | Disposition: A | Discharge status: Home / Self Care | Payer: Medicare Other | Source: Ambulatory Visit

## 2012-08-12 ENCOUNTER — Ambulatory Visit
Admission: RE | Admit: 2012-08-12 | Discharge: 2012-08-12 | Disposition: A | Discharge status: Home / Self Care | Payer: Medicare Other | Source: Ambulatory Visit | Attending: Family Medicine | Admitting: Family Medicine

## 2012-08-12 DIAGNOSIS — Z780 Asymptomatic menopausal state: Secondary | ICD-10-CM | POA: Diagnosis not present

## 2012-08-12 DIAGNOSIS — Z1231 Encounter for screening mammogram for malignant neoplasm of breast: Secondary | ICD-10-CM

## 2012-08-12 DIAGNOSIS — E2839 Other primary ovarian failure: Secondary | ICD-10-CM

## 2012-08-13 ENCOUNTER — Other Ambulatory Visit: Payer: Medicare Other

## 2012-08-13 DIAGNOSIS — Z79899 Other long term (current) drug therapy: Secondary | ICD-10-CM

## 2012-08-13 DIAGNOSIS — E785 Hyperlipidemia, unspecified: Secondary | ICD-10-CM | POA: Diagnosis not present

## 2012-08-13 LAB — HEPATIC FUNCTION PANEL
Albumin: 4.7 g/dL (ref 3.5–5.2)
Bilirubin, Direct: 0.1 mg/dL (ref 0.0–0.3)
Total Bilirubin: 0.5 mg/dL (ref 0.3–1.2)

## 2012-08-13 LAB — LIPID PANEL
HDL: 62 mg/dL (ref >39)
Total CHOL/HDL Ratio: 3 Ratio
VLDL: 25 mg/dL (ref 0–40)

## 2012-08-16 ENCOUNTER — Telehealth: Payer: Self-pay | Admitting: Family Medicine

## 2012-08-16 MED ORDER — SIMVASTATIN 20 MG PO TABS: 20.0000 mg | ORAL_TABLET | Freq: Every day | ORAL | Status: DC

## 2012-08-16 NOTE — Telephone Encounter (Signed)
Spoke to patient.  Aware of lab results.  Refills sent to pharmacy.

## 2012-08-16 NOTE — Telephone Encounter (Signed)
Message copied by Donne Anon on Mon Aug 16, 2012  3:26 PM ------      Message from: Allayne Butcher      Created: Mon Aug 16, 2012  7:58 AM       06/25/12 LDL was 193. Simvastatin 20mg  was sttarted.       Lipids now GREAT! LDL down to 102!!!  LFTs nml.      Cont Simvastatin 20mg . Send in Rx x 5 refills.      OV and fasting labs 6 mos. ------

## 2012-08-25 ENCOUNTER — Other Ambulatory Visit: Payer: Self-pay | Admitting: Family Medicine

## 2012-08-25 DIAGNOSIS — Z79899 Other long term (current) drug therapy: Secondary | ICD-10-CM

## 2012-08-25 DIAGNOSIS — E785 Hyperlipidemia, unspecified: Secondary | ICD-10-CM

## 2012-08-30 ENCOUNTER — Ambulatory Visit (INDEPENDENT_AMBULATORY_CARE_PROVIDER_SITE_OTHER): Payer: Medicare Other | Admitting: Family Medicine

## 2012-08-30 ENCOUNTER — Encounter: Payer: Self-pay | Admitting: Family Medicine

## 2012-08-30 VITALS — BP 118/78 | HR 80 | Temp 97.3°F | Resp 20 | Wt 158.0 lb

## 2012-08-30 DIAGNOSIS — L259 Unspecified contact dermatitis, unspecified cause: Secondary | ICD-10-CM | POA: Diagnosis not present

## 2012-08-30 NOTE — Progress Notes (Signed)
  Subjective:    Patient ID: Beverly Carter, female    DOB: 07-May-1945, 67 y.o.   MRN: [id number]  HPI  Patient here with rash for the past couple weeks. She did change her typical lotion to something new with a sunscreen and then she noticed red spots pop up on her arms her hands as well as the back of her calves. She's had no other medication changes. She denies any lesions in her mouth. She denies any shortness of breath, fever, joint pain associated. She's not using anything on the lesions very rarely are they pruritic. She does have known eczema and other skin allergies. Rash is improving already   Review of Systems  - per above  GEN- denies fatigue, fever, weight loss,weakness, recent illnessy Skin - +rash       Objective:   Physical Exam GEN-NAD,alert and oriented x 3 Skin- erythemataous raisedfine  maculoapular  With some peeling skin in bilateral flexoral areas of arms, calves, bilateral hands-dorsum, palms and soles spared, dry skin on neck  Ext- no edema       Assessment & Plan:

## 2012-08-30 NOTE — Assessment & Plan Note (Signed)
D/c newest lotion, doubt related to the zocor Use topicort pt has at home and regular eucerin or Aveeno which she has tolerated in the past Hold on oral steroids, benadryl as needed Lesions on neck already resolved

## 2012-08-30 NOTE — Patient Instructions (Signed)
Use the topicort  Twice a day with the eucerin cream  Call if this does not improve Stop the other lotion

## 2012-08-31 ENCOUNTER — Encounter: Payer: Self-pay | Admitting: Family Medicine

## 2012-09-08 ENCOUNTER — Other Ambulatory Visit: Payer: Self-pay

## 2012-09-10 ENCOUNTER — Ambulatory Visit (INDEPENDENT_AMBULATORY_CARE_PROVIDER_SITE_OTHER): Payer: Medicare Other | Admitting: Family Medicine

## 2012-09-10 ENCOUNTER — Encounter: Payer: Self-pay | Admitting: Family Medicine

## 2012-09-10 VITALS — BP 104/80 | HR 78 | Temp 97.0°F | Resp 16 | Wt 154.0 lb

## 2012-09-10 DIAGNOSIS — D239 Other benign neoplasm of skin, unspecified: Secondary | ICD-10-CM | POA: Diagnosis not present

## 2012-09-10 DIAGNOSIS — D235 Other benign neoplasm of skin of trunk: Secondary | ICD-10-CM | POA: Diagnosis not present

## 2012-09-10 DIAGNOSIS — D225 Melanocytic nevi of trunk: Secondary | ICD-10-CM

## 2012-09-10 NOTE — Patient Instructions (Signed)
Keep clean Put triple antibiotic cream on daily and change bandage We will call with pathology results F/U as needed Call for any signs of infection

## 2012-09-12 DIAGNOSIS — D225 Melanocytic nevi of trunk: Secondary | ICD-10-CM | POA: Insufficient documentation

## 2012-09-12 NOTE — Assessment & Plan Note (Signed)
Irritated nevus, shave completed, half of lesion was pulled from skin already sent for pathology

## 2012-09-12 NOTE — Progress Notes (Signed)
  Subjective:    Patient ID: Beverly Carter, female    DOB: 07/21/1945, 67 y.o.   MRN: [id number]  HPI  Pt here with mole changes. Chronic mole on right lower back, has been red and irritated and appears swollen past few days. Initially thought a tick was on her. Denies getting caught in her clothing.  Has had nother benign moles removed in the past.  Review of Systems - per above      Objective:   Physical Exam  GEN-NAD,alert and oriented x 3 Skin- Right lower back- flesh colored mole detached from skin half way, mild erythema surrounding, no fluctuance, no tick seen , size < 0.5cm Procedure- mole removal Procedure explained to patient questions answered benefits and risks discussed verbal consent obtained. Antiseptic-Betadine Anesthesia-lidocaine with epi 1cc Shave biopsy performed Specimen sent Minimal blood loss- drysol for hemostasis Patient tolerated procedure well Bandage applied with triple antibiotic ointment        Assessment & Plan:

## 2012-09-14 LAB — PATHOLOGY

## 2012-09-30 ENCOUNTER — Telehealth: Payer: Self-pay | Admitting: Family Medicine

## 2012-09-30 NOTE — Telephone Encounter (Signed)
Pt called stating she is having headaches for two days non stop, she has taken advil 800mg  and hr later take another one neither helped so she took oxycodone and that seems to not help either, she has fever of 101.1 hurts all over and cant walk. I told her she needed to be seen today here, she states that she is not able to drive herself or walk and that she would have to get EMS to bring her, I stated that EMS will not bring her here but will take her to hospital, she agreed that seh will just go to hospital instead.

## 2012-10-02 ENCOUNTER — Encounter (HOSPITAL_COMMUNITY): Payer: Self-pay | Admitting: Emergency Medicine

## 2012-10-02 ENCOUNTER — Emergency Department (INDEPENDENT_AMBULATORY_CARE_PROVIDER_SITE_OTHER)
Admission: EM | Admit: 2012-10-02 | Discharge: 2012-10-02 | Disposition: A | Discharge status: Home / Self Care | Payer: Medicare Other | Source: Home / Self Care | Attending: Family Medicine | Admitting: Family Medicine

## 2012-10-02 DIAGNOSIS — B349 Viral infection, unspecified: Secondary | ICD-10-CM

## 2012-10-02 DIAGNOSIS — E876 Hypokalemia: Secondary | ICD-10-CM | POA: Diagnosis not present

## 2012-10-02 DIAGNOSIS — B9789 Other viral agents as the cause of diseases classified elsewhere: Secondary | ICD-10-CM

## 2012-10-02 LAB — CBC WITH DIFFERENTIAL/PLATELET
Basophils Relative: 0 % (ref 0–1)
Eosinophils Absolute: 0 10*3/uL (ref 0.0–0.7)
MCH: 30.6 pg (ref 26.0–34.0)
MCHC: 35.5 g/dL (ref 30.0–36.0)
Neutrophils Relative %: 71 % (ref 43–77)
Platelets: 191 10*3/uL (ref 150–400)
RDW: 13 % (ref 11.5–15.5)

## 2012-10-02 LAB — POCT URINALYSIS DIP (DEVICE)
Bilirubin Urine: NEGATIVE
Ketones, ur: 15 mg/dL — AB
Leukocytes, UA: NEGATIVE
Nitrite: NEGATIVE
Protein, ur: NEGATIVE mg/dL

## 2012-10-02 LAB — BASIC METABOLIC PANEL
GFR calc Af Amer: 83 mL/min — ABNORMAL LOW (ref >90)
GFR calc non Af Amer: 71 mL/min — ABNORMAL LOW (ref >90)
Potassium: 3.1 mEq/L — ABNORMAL LOW (ref 3.5–5.1)
Sodium: 138 mEq/L (ref 135–145)

## 2012-10-02 MED ORDER — SODIUM CHLORIDE 0.9 % IV SOLN
Freq: Once | INTRAVENOUS | Status: AC
  Administered 2012-10-02: 1 mL via INTRAVENOUS

## 2012-10-02 MED ORDER — ONDANSETRON HCL 4 MG/2ML IJ SOLN
INTRAMUSCULAR | Status: AC
  Filled 2012-10-02: qty 2

## 2012-10-02 MED ORDER — ONDANSETRON HCL 4 MG PO TABS: 4.0000 mg | ORAL_TABLET | Freq: Four times a day (QID) | ORAL | Status: DC

## 2012-10-02 MED ORDER — ONDANSETRON HCL 4 MG/2ML IJ SOLN
4.0000 mg | Freq: Once | INTRAMUSCULAR | Status: AC
  Administered 2012-10-02: 4 mg via INTRAVENOUS

## 2012-10-02 MED ORDER — POTASSIUM CHLORIDE ER 10 MEQ PO CPCR: 10.0000 meq | ORAL_CAPSULE | Freq: Two times a day (BID) | ORAL | Status: DC

## 2012-10-02 NOTE — ED Notes (Signed)
Patient unable to give urine sample;  Patient given cup of water 

## 2012-10-02 NOTE — ED Notes (Signed)
Patient still unable to obtain urine specimen

## 2012-10-02 NOTE — ED Notes (Signed)
Pt c/o feeling feverish and having a headache onset Wednesday Sxs include: n/v, decreased appetite... Has had advil and oxycodone for HA w/no relief... Oxycodone is an old Rx from an old hand surg Denies: diarrhea, abd pain, cold sxs Alert w/no signs of acute distress.

## 2012-10-02 NOTE — ED Notes (Signed)
Pt reports she's feeling much better and was able to give [place] urine sample as well.

## 2012-10-02 NOTE — ED Provider Notes (Signed)
CSN: [id number]     Arrival date & time 10/02/12  1191 History   None    Chief Complaint  Patient presents with  . Headache   (Consider location/radiation/quality/duration/timing/severity/associated sxs/prior Treatment) Patient is a 67 y.o. female presenting with headaches. The history is provided by the patient.  Headache Pain location:  Generalized Quality:  Dull Onset quality:  Sudden Duration:  3 days Progression:  Unchanged Chronicity:  New Similar to prior headaches: no   Context: not coughing   Associated symptoms: fever, myalgias, nausea and vomiting   Associated symptoms: no abdominal pain, no cough, no diarrhea and no photophobia     Past Medical History  Diagnosis Date  . Allergy   . Eczema   . Lactose intolerance   . Glaucoma    Past Surgical History  Procedure Laterality Date  . Abdominal hysterectomy    . Carpal tunnel release Right    Family History  Problem Relation Age of Onset  . Heart disease Mother 77    MI   History  Substance Use Topics  . Smoking status: Never Smoker   . Smokeless tobacco: Not on file  . Alcohol Use: No   OB History   Grav Para Term Preterm Abortions TAB SAB Ect Mult Living                 Review of Systems  Constitutional: Positive for fever, activity change and appetite change.  Eyes: Negative for photophobia.  Respiratory: Negative for cough.   Gastrointestinal: Positive for nausea, vomiting and constipation. Negative for abdominal pain and diarrhea.  Genitourinary: Negative for dysuria, urgency, frequency, menstrual problem and pelvic pain.  Musculoskeletal: Positive for myalgias.  Neurological: Positive for headaches.    Allergies  Latex  Home Medications   Current Outpatient Rx  Name  Route  Sig  Dispense  Refill  . simvastatin (ZOCOR) 20 MG tablet   Oral   Take 1 tablet (20 mg total) by mouth at bedtime.   30 tablet   5   . desoximetasone (TOPICORT) 0.05 % cream   Topical   Apply topically 2  (two) times daily.         . fluticasone (FLONASE) 50 MCG/ACT nasal spray   Nasal   Place 1 spray into the nose 2 (two) times daily.   1 g   2   . loratadine (CLARITIN) 10 MG tablet   Oral   Take 10 mg by mouth daily.         Marland Kitchen nystatin-triamcinolone (MYCOLOG II) cream   Topical   Apply topically 4 (four) times daily.   30 g   1   . ondansetron (ZOFRAN) 4 MG tablet   Oral   Take 1 tablet (4 mg total) by mouth every 6 (six) hours. Prn n/v   6 tablet   0   . potassium chloride (MICRO-K) 10 MEQ CR capsule   Oral   Take 1 capsule (10 mEq total) by mouth 2 (two) times daily.   30 capsule   1    BP 128/85  Pulse 69  Temp(Src) 99.6 F (37.6 C) (Oral)  Resp 19  SpO2 98% Physical Exam  Nursing note and vitals reviewed. Constitutional: She is oriented to person, place, and time. She appears well-developed and well-nourished. No distress.  HENT:  Head: Normocephalic.  Right Ear: External ear normal.  Left Ear: External ear normal.  Mouth/Throat: Oropharynx is clear and moist.  Eyes: Pupils are equal, round, and reactive to  light.  Neck: Normal range of motion. Neck supple.  Cardiovascular: Regular rhythm and normal heart sounds.   Pulmonary/Chest: Breath sounds normal.  Abdominal: Soft. Bowel sounds are normal. She exhibits no distension and no mass. There is no tenderness. There is no rebound and no guarding.  Lymphadenopathy:    She has no cervical adenopathy.  Neurological: She is alert and oriented to person, place, and time.  Skin: No rash noted.  Psychiatric: She has a normal mood and affect. Her behavior is normal. Judgment and thought content normal.    ED Course  Procedures (including critical care time) Labs Review Labs Reviewed  BASIC METABOLIC PANEL - Abnormal; Notable for the following:    Potassium 3.1 (*)    GFR calc non Af Amer 71 (*)    GFR calc Af Amer 83 (*)    All other components within normal limits  CBC WITH DIFFERENTIAL   Imaging  Review No results found.  MDM   1. Viral illness   2. Hypokalemia    Sx much improved with ivf and zofran., labs wnl.  Linna Hoff, MD 10/02/12 [phone]

## 2012-10-03 ENCOUNTER — Emergency Department (INDEPENDENT_AMBULATORY_CARE_PROVIDER_SITE_OTHER)
Admission: EM | Admit: 2012-10-03 | Discharge: 2012-10-03 | Disposition: A | Discharge status: Home / Self Care | Payer: Medicare Other | Source: Home / Self Care | Attending: Emergency Medicine | Admitting: Emergency Medicine

## 2012-10-03 ENCOUNTER — Encounter (HOSPITAL_COMMUNITY): Payer: Self-pay | Admitting: *Deleted

## 2012-10-03 DIAGNOSIS — E876 Hypokalemia: Secondary | ICD-10-CM | POA: Diagnosis not present

## 2012-10-03 DIAGNOSIS — B9789 Other viral agents as the cause of diseases classified elsewhere: Secondary | ICD-10-CM | POA: Diagnosis not present

## 2012-10-03 DIAGNOSIS — T148 Other injury of unspecified body region: Secondary | ICD-10-CM | POA: Diagnosis not present

## 2012-10-03 DIAGNOSIS — R519 Headache, unspecified: Secondary | ICD-10-CM

## 2012-10-03 DIAGNOSIS — W57XXXA Bitten or stung by nonvenomous insect and other nonvenomous arthropods, initial encounter: Secondary | ICD-10-CM

## 2012-10-03 DIAGNOSIS — B349 Viral infection, unspecified: Secondary | ICD-10-CM

## 2012-10-03 DIAGNOSIS — R51 Headache: Secondary | ICD-10-CM | POA: Diagnosis not present

## 2012-10-03 MED ORDER — DOXYCYCLINE HYCLATE 100 MG PO CAPS: 100.0000 mg | ORAL_CAPSULE | Freq: Two times a day (BID) | ORAL | Status: DC

## 2012-10-03 NOTE — ED Notes (Addendum)
Patient states that she thinks that she has [place] spotted fever after removal of a tick. Was in clinic yesterday for dehydration; states she did not mention to doctor that she had tick bite; she states it has been 5 days since tick bite and is worried that she might have the beginnings of spotted fever; wants test to screen for [facility] Spotted fever.

## 2012-10-03 NOTE — ED Notes (Signed)
Answered patient questions, called  Concerned for rmsf-informed test not drawn yesterday.  Patient reports she wants to come back for test and treatment.  Repeatedly told this decision would be up to physician, this nurse could not promise anything.

## 2012-10-03 NOTE — ED Provider Notes (Signed)
CSN: [id number]     Arrival date & time 10/03/12  1000 History   First MD Initiated Contact with Patient 10/03/12 1031     Chief Complaint  Patient presents with  . Headache  . Fever   Patient is a 67 y.o. female presenting with fever. The history is provided by the patient.  Fever Associated symptoms: myalgias, nausea and vomiting   Associated symptoms: no congestion, no ear pain and no rhinorrhea    Chief Complaint   Patient presents with   .  Headache   HPI: Patient is a 67 y.o. female presenting with headaches. The history is provided by the patient.  Headache  Pain location: Generalized  Quality: Dull  Onset quality: Sudden  Duration: 3 days  Progression: Unchanged  Chronicity: New Similar to prior headaches: no  Context: not coughing  Associated symptoms: fever, myalgias, nausea and vomiting  Associated symptoms: no abdominal pain, no cough, no diarrhea and no photophobia Pt seen here yesterday and rec'd labwork and IVF's. She now states she is convinced that a recent mole she had removed "had a tick in it".  Pt has been on the Internet and feels her symptoms are c/w RMSP. She is requesting screening and treatment for same.   Past Medical History  Diagnosis Date  . Allergy   . Eczema   . Lactose intolerance   . Glaucoma    Past Surgical History  Procedure Laterality Date  . Abdominal hysterectomy    . Carpal tunnel release Right    Family History  Problem Relation Age of Onset  . Heart disease Mother 90    MI   History  Substance Use Topics  . Smoking status: Never Smoker   . Smokeless tobacco: Not on file  . Alcohol Use: No   OB History   Grav Para Term Preterm Abortions TAB SAB Ect Mult Living                 Review of Systems  Constitutional: Positive for fever. Negative for appetite change.  HENT: Negative for ear pain, congestion, rhinorrhea, sneezing, neck pain and neck stiffness.   Eyes: Negative.   Respiratory: Negative.   Cardiovascular:  Negative.   Gastrointestinal: Positive for nausea and vomiting. Negative for abdominal pain.  Endocrine: Negative.   Genitourinary: Negative.   Musculoskeletal: Positive for myalgias.  Allergic/Immunologic: Negative.   Neurological: Negative.   Hematological: Negative.   Psychiatric/Behavioral: Negative.     Allergies  Latex  Home Medications   Current Outpatient Rx  Name  Route  Sig  Dispense  Refill  . ondansetron (ZOFRAN) 4 MG tablet   Oral   Take 1 tablet (4 mg total) by mouth every 6 (six) hours. Prn n/v   6 tablet   0   . potassium chloride (MICRO-K) 10 MEQ CR capsule   Oral   Take 1 capsule (10 mEq total) by mouth 2 (two) times daily.   30 capsule   1   . simvastatin (ZOCOR) 20 MG tablet   Oral   Take 1 tablet (20 mg total) by mouth at bedtime.   30 tablet   5   . desoximetasone (TOPICORT) 0.05 % cream   Topical   Apply topically 2 (two) times daily.         . fluticasone (FLONASE) 50 MCG/ACT nasal spray   Nasal   Place 1 spray into the nose 2 (two) times daily.   1 g   2   . loratadine (  CLARITIN) 10 MG tablet   Oral   Take 10 mg by mouth daily.         Marland Kitchen nystatin-triamcinolone (MYCOLOG II) cream   Topical   Apply topically 4 (four) times daily.   30 g   1    BP 156/89  Pulse 66  Temp(Src) 98.7 F (37.1 C) (Oral)  Resp 17  SpO2 98% Physical Exam  Constitutional: She is oriented to person, place, and time. She appears well-developed and well-nourished.  HENT:  Head: Normocephalic and atraumatic.  Eyes: Conjunctivae are normal.  Neck: Neck supple.  Cardiovascular: Normal rate.   Pulmonary/Chest: Effort normal.  Musculoskeletal: Normal range of motion.  Neurological: She is alert and oriented to person, place, and time.  Skin: Skin is warm and dry.  Small approx .5 cm circular area to (R) lower back where pt reports she had a mole removed recently. No signs of infection, cellulitis or unusual rash.  Psychiatric: Her mood appears  anxious.    ED Course  Procedures (including critical care time) Labs Review Labs Reviewed  ROCKY MTN SPOTTED FVR AB, IGG-BLOOD  ROCKY MTN SPOTTED FVR AB, IGM-BLOOD   Imaging Review No results found.  MDM  Pt requesting screening and tx for RMSP. Seen yesterday for viral type sx's and has since been on internet and feels her symptoms are c/w tick born illness. Believes she had a tick in mole that was removed by her PCP approx 2 weeks ago.  Though pt appears quite well at this time discussed w/ Dr Ventura Bruns [place], will screen for RMSF and Lyme's disease. Will place on Doxycycline 100mg  BID x 7 days. Pt encouraged to f/u w/ PCP for elevated BP.     Leanne Chang, NP 10/03/12 screening examination/treatment/ procedure(s) were performed by non-physician practitioner and as supervising physician I was immediately available for consultations/colaborattion.   [employer] [place],M.D.  Duwayne Heck de Marcello Moores, MD 10/03/12 1249

## 2012-10-05 ENCOUNTER — Telehealth: Payer: Self-pay | Admitting: Family Medicine

## 2012-10-05 LAB — ROCKY MTN SPOTTED FVR AB, IGM-BLOOD
RMSF IgM: 0.61 IV (ref 0.00–0.89)
RMSF IgM: 0.56 IV (ref 0.00–0.89)

## 2012-10-05 LAB — ROCKY MTN SPOTTED FVR AB, IGG-BLOOD: RMSF IgG: 0.09 IV

## 2012-10-05 NOTE — Telephone Encounter (Signed)
Pt called in given doxycycline by UC due to possible tick related illness.  Took Doxycycline had nausea and vomiting, but has had throughout the weekend before starting medication Wanted to know if she could take with food Advised applesauce of toast  No current rash She thinks there was a tick in the mole I removed about 4 weeks ago, started having headache, fever, and muscle aches. Dx viral illness iN UC , then returned 24 hours later and labs were sent for RMSF and Lyme

## 2012-10-16 DIAGNOSIS — Z23 Encounter for immunization: Secondary | ICD-10-CM | POA: Diagnosis not present

## 2012-10-29 ENCOUNTER — Encounter: Payer: Self-pay | Admitting: Physician Assistant

## 2012-10-29 ENCOUNTER — Other Ambulatory Visit: Payer: Self-pay | Admitting: Family Medicine

## 2012-10-29 DIAGNOSIS — E876 Hypokalemia: Secondary | ICD-10-CM

## 2012-11-04 ENCOUNTER — Other Ambulatory Visit: Payer: Medicare Other

## 2012-11-04 DIAGNOSIS — E876 Hypokalemia: Secondary | ICD-10-CM | POA: Diagnosis not present

## 2012-11-04 LAB — BASIC METABOLIC PANEL
BUN: 13 mg/dL (ref 6–23)
CO2: 29 mEq/L (ref 19–32)
Calcium: 9.8 mg/dL (ref 8.4–10.5)
Creat: 0.9 mg/dL (ref 0.50–1.10)

## 2012-11-22 ENCOUNTER — Encounter: Payer: Self-pay | Admitting: Physician Assistant

## 2012-12-10 ENCOUNTER — Encounter: Payer: Self-pay | Admitting: Physician Assistant

## 2013-01-25 ENCOUNTER — Encounter: Payer: Self-pay | Admitting: Family Medicine

## 2013-02-18 ENCOUNTER — Other Ambulatory Visit: Payer: Medicare Other

## 2013-02-18 DIAGNOSIS — E785 Hyperlipidemia, unspecified: Secondary | ICD-10-CM

## 2013-02-18 DIAGNOSIS — Z79899 Other long term (current) drug therapy: Secondary | ICD-10-CM

## 2013-02-18 LAB — CBC WITH DIFFERENTIAL/PLATELET
Basophils Absolute: 0 10*3/uL (ref 0.0–0.1)
Basophils Relative: 0 % (ref 0–1)
EOS PCT: 3 % (ref 0–5)
Eosinophils Absolute: 0.1 10*3/uL (ref 0.0–0.7)
HEMATOCRIT: 40.2 % (ref 36.0–46.0)
HEMOGLOBIN: 13.6 g/dL (ref 12.0–15.0)
LYMPHS ABS: 1.7 10*3/uL (ref 0.7–4.0)
LYMPHS PCT: 35 % (ref 12–46)
MCH: 29.6 pg (ref 26.0–34.0)
MCHC: 33.8 g/dL (ref 30.0–36.0)
MCV: 87.6 fL (ref 78.0–100.0)
MONO ABS: 0.4 10*3/uL (ref 0.1–1.0)
MONOS PCT: 8 % (ref 3–12)
Neutro Abs: 2.7 10*3/uL (ref 1.7–7.7)
Neutrophils Relative %: 54 % (ref 43–77)
Platelets: 203 10*3/uL (ref 150–400)
RBC: 4.59 MIL/uL (ref 3.87–5.11)
RDW: 13.8 % (ref 11.5–15.5)
WBC: 4.9 10*3/uL (ref 4.0–10.5)

## 2013-02-18 LAB — LIPID PANEL
Cholesterol: 239 mg/dL — ABNORMAL HIGH (ref 0–200)
HDL: 57 mg/dL (ref >39)
LDL Cholesterol: 160 mg/dL — ABNORMAL HIGH (ref 0–99)
Total CHOL/HDL Ratio: 4.2 Ratio
Triglycerides: 108 mg/dL (ref <150)
VLDL: 22 mg/dL (ref 0–40)

## 2013-02-18 LAB — COMPLETE METABOLIC PANEL WITH GFR
ALBUMIN: 4.3 g/dL (ref 3.5–5.2)
ALT: 15 U/L (ref 0–35)
AST: 21 U/L (ref 0–37)
Alkaline Phosphatase: 92 U/L (ref 39–117)
BUN: 16 mg/dL (ref 6–23)
CO2: 26 meq/L (ref 19–32)
Calcium: 9.5 mg/dL (ref 8.4–10.5)
Chloride: 101 mEq/L (ref 96–112)
Creat: 0.82 mg/dL (ref 0.50–1.10)
GFR, EST AFRICAN AMERICAN: 84 mL/min
GFR, EST NON AFRICAN AMERICAN: 73 mL/min
GLUCOSE: 83 mg/dL (ref 70–99)
POTASSIUM: 3.8 meq/L (ref 3.5–5.3)
SODIUM: 138 meq/L (ref 135–145)
TOTAL PROTEIN: 6.7 g/dL (ref 6.0–8.3)
Total Bilirubin: 0.4 mg/dL (ref 0.3–1.2)

## 2013-03-03 ENCOUNTER — Encounter: Payer: Self-pay | Admitting: Family Medicine

## 2013-03-09 ENCOUNTER — Encounter: Payer: Self-pay | Admitting: Physician Assistant

## 2013-03-09 ENCOUNTER — Ambulatory Visit (INDEPENDENT_AMBULATORY_CARE_PROVIDER_SITE_OTHER): Payer: Medicare Other | Admitting: Physician Assistant

## 2013-03-09 VITALS — BP 126/82 | HR 80 | Temp 97.0°F | Resp 18 | Ht 65.5 in | Wt 155.0 lb

## 2013-03-09 DIAGNOSIS — E785 Hyperlipidemia, unspecified: Secondary | ICD-10-CM | POA: Insufficient documentation

## 2013-03-09 NOTE — Progress Notes (Signed)
Patient ID: Beverly Carter MRN: [id number], DOB: Jan 24, 1946, 68 y.o. Date of Encounter: 03/09/2013, 5:27 PM    Chief Complaint:  Chief Complaint  Patient presents with  . post lab follow up     HPI: 68 y.o. year old white female is here for followup.  She had a complete physical exam with me 06/24/12. She was not fasting that day so she returned for following morning fasting. Labs done on 06/25/12 showed elevated cholesterol. Remainder of labs were normal. However her lipid panel showed LDL of 193. Therefore I told her to start simvastatin 20 mg and come for repeat lab work. She did start the simvastatin 20 and she did have followup lab work on 08/13/2012.  At that time LDL was down to 102. However, today she reports that sometime around October or November she stopped the simvastatin. Says that it was causing her to have a crawling sensation on her legs and was causing significant myalgias and muscle cramps. She stopped the simvastatin and all of his symptoms resolved.  She recently came in for followup lab work and then was told to schedule a followup office visit. Says that she is just here because she was told that she needed to be here.     Home Meds: See attached medication section for any medications that were entered at today's visit. The computer does not put those onto this list.The following list is a list of meds entered prior to today's visit.   Current Outpatient Prescriptions on File Prior to Visit  Medication Sig Dispense Refill  . desoximetasone (TOPICORT) 0.05 % cream Apply topically 2 (two) times daily.      . fluticasone (FLONASE) 50 MCG/ACT nasal spray Place 1 spray into the nose 2 (two) times daily.  1 g  2  . loratadine (CLARITIN) 10 MG tablet Take 10 mg by mouth daily.      Marland Kitchen nystatin-triamcinolone (MYCOLOG II) cream Apply topically 4 (four) times daily.  30 g  1  . ondansetron (ZOFRAN) 4 MG tablet Take 1 tablet (4 mg total) by mouth every 6 (six) hours. Prn n/v   6 tablet  0   No current facility-administered medications on file prior to visit.    Allergies:  Allergies  Allergen Reactions  . Latex   . Simvastatin     Myalgias      Review of Systems: See HPI for pertinent ROS. All other ROS negative.    Physical Exam: Blood pressure 126/82, pulse 80, temperature 97 F (36.1 C), temperature source Oral, resp. rate 18, height 5' 5.5" (1.664 m), weight 155 lb (70.308 kg)., Body mass index is 25.39 kg/(m^2). General: WNWD WF. Appears in no acute distress. Neck: Supple. No thyromegaly. No lymphadenopathy. No carotid bruits. Lungs: Clear bilaterally to auscultation without wheezes, rales, or rhonchi. Breathing is unlabored. Heart: Regular rhythm. No murmurs, rubs, or gallops. Msk:  Strength and tone normal for age. Extremities/Skin: Warm and dry. Neuro: Alert and oriented X 3. Moves all extremities spontaneously. Gait is normal. CNII-XII grossly in tact. Psych:  Responds to questions appropriately with a normal affect.     ASSESSMENT AND PLAN:  68 y.o. year old female with  1. Hyperlipidemia 06/25/12 when on no medication LDL was 193. 08/13/12 on simvastatin 20 LDL was 102. She recently came 02/18/13 and had lipid panel, CBC, CMET.  CBC and CMET were normal. Lipid panel showed LDL 160. Today we discussed her cardiac risk factors and discussed whether or not to start  pravastatin. She states that her mother had an MI at age 36. That was her first diagnosed CAD. She says that her mother had been a smoker, was obese, and was sedentary. Says that her father lived to age 61 and had no CAD. At this time she wants to stay off of medication and will monitor. We will recheck in 6 months. If LDL increases, will recommend adding pravastatin.   Marin Olp [place], [place], [facility] 03/09/2013 5:27 PM

## 2013-05-05 ENCOUNTER — Encounter: Payer: Self-pay | Admitting: Physician Assistant

## 2013-05-05 ENCOUNTER — Ambulatory Visit (INDEPENDENT_AMBULATORY_CARE_PROVIDER_SITE_OTHER): Payer: Medicare Other | Admitting: Physician Assistant

## 2013-05-05 VITALS — BP 116/72 | HR 96 | Temp 99.5°F | Resp 18 | Ht 64.5 in | Wt 156.0 lb

## 2013-05-05 DIAGNOSIS — R110 Nausea: Secondary | ICD-10-CM

## 2013-05-05 DIAGNOSIS — R109 Unspecified abdominal pain: Secondary | ICD-10-CM | POA: Diagnosis not present

## 2013-05-05 DIAGNOSIS — R509 Fever, unspecified: Secondary | ICD-10-CM

## 2013-05-05 DIAGNOSIS — K5792 Diverticulitis of intestine, part unspecified, without perforation or abscess without bleeding: Secondary | ICD-10-CM

## 2013-05-05 DIAGNOSIS — K5732 Diverticulitis of large intestine without perforation or abscess without bleeding: Secondary | ICD-10-CM

## 2013-05-05 LAB — CBC W/MCH & 3 PART DIFF
HCT: 42.3 % (ref 36.0–46.0)
HEMOGLOBIN: 14.6 g/dL (ref 12.0–15.0)
LYMPHS ABS: 1.2 10*3/uL (ref 0.7–4.0)
Lymphocytes Relative: 10 % — ABNORMAL LOW (ref 12–46)
MCH: 30.7 pg (ref 26.0–34.0)
MCHC: 34.5 g/dL (ref 30.0–36.0)
MCV: 89.1 fL (ref 78.0–100.0)
NEUTROS PCT: 86 % — AB (ref 43–77)
Neutro Abs: 10.6 10*3/uL — ABNORMAL HIGH (ref 1.7–7.7)
Platelets: 202 10*3/uL (ref 150–400)
RBC: 4.75 MIL/uL (ref 3.87–5.11)
RDW: 14.4 % (ref 11.5–15.5)
WBC mixed population: 0.4 10*3/uL (ref 0.1–1.8)
WBC: 12.3 10*3/uL — ABNORMAL HIGH (ref 4.0–10.5)
WBCMIXPER: 4 % (ref 3–18)

## 2013-05-05 LAB — URINALYSIS, ROUTINE W REFLEX MICROSCOPIC
BILIRUBIN URINE: NEGATIVE
Glucose, UA: NEGATIVE mg/dL
Hgb urine dipstick: NEGATIVE
KETONES UR: 40 mg/dL — AB
Leukocytes, UA: NEGATIVE
NITRITE: NEGATIVE
PH: 6.5 (ref 5.0–8.0)
Protein, ur: NEGATIVE mg/dL
SPECIFIC GRAVITY, URINE: 1.015 (ref 1.005–1.030)
Urobilinogen, UA: 0.2 mg/dL (ref 0.0–1.0)

## 2013-05-05 MED ORDER — CIPROFLOXACIN HCL 500 MG PO TABS: 500.0000 mg | ORAL_TABLET | Freq: Two times a day (BID) | ORAL | Status: DC

## 2013-05-05 MED ORDER — METRONIDAZOLE 500 MG PO TABS: 500.0000 mg | ORAL_TABLET | Freq: Three times a day (TID) | ORAL | Status: DC

## 2013-05-05 NOTE — Progress Notes (Signed)
Patient ID: Beverly Carter MRN: [id number], DOB: [birth year], 68 y.o. Date of Encounter: @DATE @  Chief Complaint:  Chief Complaint  Patient presents with  . abd pain    bloating, pain, fever, chills, nausea,  no BM feels like has to go,  also feels like need to urinate but does not    HPI: 68 y.o. year old white female  presents with the above symptoms.  Says that when she had a colonoscopy many years ago she was told she had diverticulosis but she has never actually had an episode of diverticulitis. However, she says everything she has read about diverticulitis as the symptoms that she is having.  Says the symptoms started on Monday 05/02/13. Today is now Thursday. Says that on Monday she first thought that her abdominal pain she felt was  because of something she had eaten. However, she says that on the following day , Tuesday, the same thing happened. She again developed pain in her left lower and lower mid abdomen. Again yesterday, Wednesday and same thing occurred through the day. Also she says that she just feels swollen and bloated across her entire abdomen. Says that she was having symptomatic  Chills she checked her temperature and got 99. Has had no higher fevers than this. Felt some nausea but no vomiting. Has Had no diarrhea. Actually says that she has had no BM since Tuesday and that that last bowel movement was normal. She had no diarrhea or loose stools at that time. Also feels swollen and bloated across her entire abdomen. Also feels like her bladder is full like she needs to urinate but then only small amount of urine present. However she is voiding and getting out normal amount urine.   Past Medical History  Diagnosis Date  . Allergy   . Eczema   . Lactose intolerance   . Glaucoma      Home Meds: See attached medication section for current medication list. Any medications entered into computer today will not appear on this note's list. The medications listed  below were entered prior to today. Current Outpatient Prescriptions on File Prior to Visit  Medication Sig Dispense Refill  . desoximetasone (TOPICORT) 0.05 % cream Apply topically 2 (two) times daily as needed.       . loratadine (CLARITIN) 10 MG tablet Take 10 mg by mouth daily.       No current facility-administered medications on file prior to visit.    Allergies:  Allergies  Allergen Reactions  . Latex   . Simvastatin     Myalgias    History   Social History  . Marital Status: Single    Spouse Name: N/A    Number of Children: N/A  . Years of Education: N/A   Occupational History  . Not on file.   Social History Main Topics  . Smoking status: Never Smoker   . Smokeless tobacco: Not on file  . Alcohol Use: No  . Drug Use: No  . Sexual Activity: Not on file   Other Topics Concern  . Not on file   Social History Narrative  . No narrative on file    Family History  Problem Relation Age of Onset  . Heart disease Mother 8    MI     Review of Systems:  See HPI for pertinent ROS. All other ROS negative.    Physical Exam: Blood pressure 116/72, pulse 96, temperature 99.5 F (37.5 C), temperature source Oral, resp. rate 18, height  5' 4.5" (1.638 m), weight 156 lb (70.761 kg)., Body mass index is 26.37 kg/(m^2). General: WNWD WF. Does not appear ill or toxic .Appears in no acute distress. Neck: Supple. No thyromegaly. No lymphadenopathy. Lungs: Clear bilaterally to auscultation without wheezes, rales, or rhonchi. Breathing is unlabored. Heart: RRR with S1 S2. No murmurs, rubs, or gallops. Abdomen: Soft,  non-distended with normoactive bowel sounds. No hepatomegaly. No rebound/guarding. No obvious abdominal masses. THere is MILD tenderness with  palpation at the midline suprapubic area and in the left lower quadrant. No other area of tenderness with palpation. Musculoskeletal:  Strength and tone normal for age. Extremities/Skin: Warm and dry.  No edema.  Neuro:  Alert and oriented X 3. Moves all extremities spontaneously. Gait is normal. CNII-XII grossly in tact. Psych:  Responds to questions appropriately with a normal affect.   Results for orders placed in visit on 05/05/13  URINALYSIS, ROUTINE W REFLEX MICROSCOPIC      Result Value Ref Range   Color, Urine YELLOW  YELLOW   APPearance CLEAR  CLEAR   Specific Gravity, Urine 1.015  1.005 - 1.030   pH 6.5  5.0 - 8.0   Glucose, UA NEG  NEG mg/dL   Bilirubin Urine NEG  NEG   Ketones, ur 40 (*) NEG mg/dL   Hgb urine dipstick NEG  NEG   Protein, ur NEG  NEG mg/dL   Urobilinogen, UA 0.2  0.0 - 1.0 mg/dL   Nitrite NEG  NEG   Leukocytes, UA NEG  NEG  CBC W/MCH & 3 PART DIFF      Result Value Ref Range   WBC 12.3 (*) 4.0 - 10.5 K/uL   RBC 4.75  3.87 - 5.11 MIL/uL   Hemoglobin 14.6  12.0 - 15.0 g/dL   HCT 42.3  36.0 - 46.0 %   MCV 89.1  78.0 - 100.0 fL   MCH 30.7  26.0 - 34.0 pg   MCHC 34.5  30.0 - 36.0 g/dL   RDW 14.4  11.5 - 15.5 %   Platelets 202  150 - 400 K/uL   Neutrophils Relative % 86 (*) 43 - 77 %   Neutro Abs 10.6 (*) 1.7 - 7.7 K/uL   Lymphocytes Relative 10 (*) 12 - 46 %   Lymphs Abs 1.2  0.7 - 4.0 K/uL   WBC mixed population % 4  3 - 18 %   WBC mixed population 0.4  0.1 - 1.8 K/uL     ASSESSMENT AND PLAN:  68 y.o. year old female with  1. Diverticulitis Symptoms, exam findings, lab findings are all consistent with diverticulitis. Therefore we'll go ahead and treat for diverticulitis. However, I told patient to followup immediately if she develops increased fever or worsening or changing symptoms. She is to have clear liquid diet for at least 48-hours. He is to start these medications immediately.  Again, follow up immediately if develops increased fever or worsening or changing symptoms. Also, if has no bowel movement in the next 48 hours, then follow up immediately. - ciprofloxacin (CIPRO) 500 MG tablet; Take 1 tablet (500 mg total) by mouth 2 (two) times daily.  Dispense: 20  tablet; Refill: 0 - metroNIDAZOLE (FLAGYL) 500 MG tablet; Take 1 tablet (500 mg total) by mouth 3 (three) times daily.  Dispense: 30 tablet; Refill: 0  2. Abdominal pain, unspecified site - Urinalysis, Routine w reflex microscopic - CBC w/MCH & 3 Part Diff  3. Fever, unspecified - Urinalysis, Routine w reflex microscopic -  CBC w/MCH & 3 Part Diff  4. Nausea alone - Urinalysis, Routine w reflex microscopic - CBC w/MCH & 3 Part Diff   Signed, [address] [place], [place], [facility] 05/05/2013 12:13 PM

## 2013-05-16 ENCOUNTER — Encounter: Payer: Self-pay | Admitting: Family Medicine

## 2013-05-16 ENCOUNTER — Telehealth: Payer: Self-pay | Admitting: Family Medicine

## 2013-05-16 NOTE — Telephone Encounter (Signed)
See email note from this day for result of this issue

## 2013-05-16 NOTE — Telephone Encounter (Signed)
Tell pt that the diarrhea is probably secondary to taking the medications (she was having no diarrhea at the time of the OV).  Recommend she take probiotic DAILY.  This will get things back in normal balance inside of her gut. In regards to the headache and the muscle aches, if these are secondary to the medications then these should resolve over the next few days as she should be completed the medications.  Come in for another office visit if symptoms do not improve over the next few days. ADD DAILY PROBIOTIC

## 2013-05-16 NOTE — Telephone Encounter (Signed)
Pt called and given provider recommendations 

## 2013-05-16 NOTE — Telephone Encounter (Signed)
Tell pt that the diarrhea is probably secondary to taking the medications (she was having no diarrhea at the time of the OV).  Recommend she take probiotic DAILY.  This will get things back in normal balance inside of her gut. In regards to the headache and the muscle aches, if these are secondary to the medications then these should resolve over the next few days as she should be completed the medications.  Come in for another office visit if symptoms do not improve over the next few days. ADD DAILY PROBIOTIC 

## 2013-05-16 NOTE — Telephone Encounter (Signed)
Still not feeling any better.  Still having very watery stools.  Also a lot of sharpe joint pain.  Now causing problems with activities.  What can we do now?

## 2013-05-18 ENCOUNTER — Telehealth: Payer: Self-pay | Admitting: Family Medicine

## 2013-05-18 ENCOUNTER — Ambulatory Visit (INDEPENDENT_AMBULATORY_CARE_PROVIDER_SITE_OTHER): Payer: Medicare Other | Admitting: Physician Assistant

## 2013-05-18 ENCOUNTER — Encounter: Payer: Self-pay | Admitting: Physician Assistant

## 2013-05-18 VITALS — BP 118/76 | HR 86 | Temp 98.7°F | Resp 18 | Ht 64.0 in | Wt 154.0 lb

## 2013-05-18 DIAGNOSIS — R197 Diarrhea, unspecified: Secondary | ICD-10-CM

## 2013-05-18 DIAGNOSIS — K5732 Diverticulitis of large intestine without perforation or abscess without bleeding: Secondary | ICD-10-CM | POA: Diagnosis not present

## 2013-05-18 DIAGNOSIS — Z1211 Encounter for screening for malignant neoplasm of colon: Secondary | ICD-10-CM

## 2013-05-18 DIAGNOSIS — K5792 Diverticulitis of intestine, part unspecified, without perforation or abscess without bleeding: Secondary | ICD-10-CM

## 2013-05-18 NOTE — Telephone Encounter (Signed)
Still not any better.  Still with diarrhea, probiotic has not helped at all.  Joint pain has improved.  What can she do now??  Also said she is wanting Colonoscopy??  Has it been requested??  Have we received records from [facility]

## 2013-05-18 NOTE — Telephone Encounter (Signed)
Have her  Schedule a followup visit.

## 2013-05-18 NOTE — Telephone Encounter (Signed)
Appt made to see pt today

## 2013-05-19 ENCOUNTER — Encounter: Payer: Self-pay | Admitting: Family Medicine

## 2013-05-20 ENCOUNTER — Other Ambulatory Visit (INDEPENDENT_AMBULATORY_CARE_PROVIDER_SITE_OTHER): Payer: Medicare Other

## 2013-05-20 ENCOUNTER — Ambulatory Visit (INDEPENDENT_AMBULATORY_CARE_PROVIDER_SITE_OTHER): Payer: Medicare Other | Admitting: Gastroenterology

## 2013-05-20 ENCOUNTER — Encounter: Payer: Self-pay | Admitting: Gastroenterology

## 2013-05-20 VITALS — BP 120/60 | HR 70 | Ht 65.5 in | Wt 157.4 lb

## 2013-05-20 DIAGNOSIS — Z1211 Encounter for screening for malignant neoplasm of colon: Secondary | ICD-10-CM

## 2013-05-20 DIAGNOSIS — R197 Diarrhea, unspecified: Secondary | ICD-10-CM

## 2013-05-20 LAB — IGA: IgA: 125 mg/dL (ref 68–378)

## 2013-05-20 MED ORDER — MOVIPREP 100 G PO SOLR: 1.0000 | Freq: Once | ORAL | Status: DC

## 2013-05-20 NOTE — Patient Instructions (Signed)
Your physician has requested that you go to the basement for the following lab work before leaving today:  TTG, IGA  You have been scheduled for a colonoscopy with propofol. Please follow written instructions given to you at your visit today.  Please pick up your prep kit at the pharmacy within the next 1-3 days. If you use inhalers (even only as needed), please bring them with you on the day of your procedure. Your physician has requested that you go to www.startemmi.com and enter the access code given to you at your visit today. This web site gives a general overview about your procedure. However, you should still follow specific instructions given to you by our office regarding your preparation for the procedure.   Continue taking your probiotic and use Imodium as needed.  Lactose Intolerance, Adult Lactose intolerance is when the body is not able to digest lactose, a sugar found in milk and milk products. Lactose intolerance is caused by your body not producing enough of the enzyme lactase. When there is not enough lactase to digest the amount of lactose consumed, discomfort may be felt. Lactose intolerance is not a milk allergy. For most people, lactase deficiency is a condition that develops naturally over time. After about the age of 2, the body begins to produce less lactase. But many people may not experience symptoms until they are much older. CAUSES Things that can cause you to be lactose intolerant include:  Aging.  Being born without the ability to make lactase.  Certain digestive diseases.  Injuries to the small intestine. SYMPTOMS   Feeling sick to your stomach (nauseous).  Diarrhea.  Cramps.  Bloating.  Gas. Symptoms usually show up a half hour or 2 hours after eating or drinking products containing lactose. TREATMENT  No treatment can improve the body's ability to produce lactase. However, symptoms can be controlled through diet. A medicine may be given to you to  take when you consume lactose-containing foods or drinks. The medicine contains the lactase enzyme, which help the body digest lactose better. HOME CARE INSTRUCTIONS  Eat or drink dairy products as told by your caregiver or dietician.  Take all medicine as directed by your caregiver.  Find lactose-free or lactose-reduced products at your local grocery store.  Talk to your caregiver or dietician to decide if you need any dietary supplements. The following is the amount of calcium needed from the diet:  19 to 50 years: 1000 mg  Over 50 years: 1200 mg Calcium and Lactose in Common Foods Non-Dairy Products / Calcium Content (mg)  Calcium-fortified orange juice, 1 cup / 308 to 344 mg  Sardines, with edible bones, 3 oz / 270 mg  Salmon, canned, with edible bones, 3 oz / 205 mg  Soymilk, fortified, 1 cup / 200 mg  Broccoli (raw), 1 cup / 90 mg  Orange, 1 medium / 50 mg  Pinto beans,  cup / 40 mg  Tuna, canned, 3 oz / 10 mg  Lettuce greens,  cup / 10 mg Dairy Products / Calcium Content (mg) / Lactose Content (g)  Yogurt, plain, low-fat, 1 cup / 415 mg / 5 g  Milk, reduced fat, 1 cup / 295 mg / 11 g  Swiss cheese, 1 oz / 270 mg / 1 g  Ice cream,  cup / 85 mg / 6 g  Cottage cheese,  cup / 75 mg / 2 to 3 g SEEK MEDICAL CARE IF: You have no relief from your symptoms. Document Released:  01/20/2005 Document Revised: 04/14/2011 Document Reviewed: 04/19/2010 ExitCare Patient Information 2014 [place].

## 2013-05-20 NOTE — Progress Notes (Signed)
Patient ID: Beverly Carter MRN: [id number], DOB: 1945-06-20, 68 y.o. Date of Encounter: @DATE @  Chief Complaint:  Chief Complaint  Patient presents with  . Abdominal Pain    HPI: 68 y.o. year old white female  presents for f/u  of office visit dated 05/05/13.  At the office visit 05/05/13 she reported the following: Says that when she had a colonoscopy many years ago she was told she had diverticulosis but she has never actually had an episode of diverticulitis. However, she says everything she has read about diverticulitis as the symptoms that she is having.  Says the symptoms started on Monday 05/02/13. Today is now Thursday. Says that on Monday she first thought that her abdominal pain she felt was  because of something she had eaten. However, she says that on the following day , Tuesday, the same thing happened. She again developed pain in her left lower and lower mid abdomen. Again yesterday, Wednesday and same thing occurred through the day. Also she says that she just feels swollen and bloated across her entire abdomen. Says that she was having symptomatic  Chills she checked her temperature and got 99. Has had no higher fevers than this. Felt some nausea but no vomiting. Has Had no diarrhea. Actually says that she has had no BM since Tuesday and that that last bowel movement was normal. She had no diarrhea or loose stools at that time. Also feels swollen and bloated across her entire abdomen. Also feels like her bladder is full like she needs to urinate but then only small amount of urine present. However she is voiding and getting out normal amount urine.  At the visit 05/05/13 exam was notable for some mild tenderness with palpation at the midline suprapubic area and in the left lower quadrant. Remainder of exam was normal. Urinalysis was normal. CBC showed white count of 12.3 and neutrophils slightly elevated. At that time it was felt that she had diverticulitis. At that time I  prescribed Cipo 500 mg 1 twice a day x10 days and Flagyl 500 mg 1 3 times a day for 10 days. She was to have clear liquid diet for 48 hours.  TODAY: Today she reports that the day after her office visit here she developed diarrhea. Says it is not the kind of diarrhea that she has had with stomach viruses in the past.  Says there is no crampy pain with it. Says it does not come out explosive. Says it's just like when urine comes out-- that when she goes to the bathroom, brown water comes from her rectum. Occurs 3 or 4 times per day. Says it is like a brown water. Says she still feels some minimal amount of discomfort in the lower mid and left abdomen but it is minimal.  Later in the conversation says that she did not mention this at her last visit because she was embarrassed to telll me but for the past 3 months has had times where she pulls down her pants to go to the bathroom and there is stool in her underwear. Says it is formed stool and not liquidy diarrhea. Says she will not even know that it came out and was there. Says her last colonoscopy was about 10 years ago and was done in [place]. Is requesting that she follow up for a colonoscopy as well.     Past Medical History  Diagnosis Date  . Allergy   . Eczema   . Lactose intolerance   .  Glaucoma   . Diverticulosis of colon (without mention of hemorrhage) 2005     Home Meds: See attached medication section for current medication list. Any medications entered into computer today will not appear on this note's list. The medications listed below were entered prior to today. Current Outpatient Prescriptions on File Prior to Visit  Medication Sig Dispense Refill  . ciprofloxacin (CIPRO) 500 MG tablet Take 1 tablet (500 mg total) by mouth 2 (two) times daily.  20 tablet  0  . desoximetasone (TOPICORT) 0.05 % cream Apply topically 2 (two) times daily as needed.       . fluticasone (FLONASE) 50 MCG/ACT nasal spray Place 1 spray into the nose 2  (two) times daily as needed.      . loratadine (CLARITIN) 10 MG tablet Take 10 mg by mouth daily.      . metroNIDAZOLE (FLAGYL) 500 MG tablet Take 1 tablet (500 mg total) by mouth 3 (three) times daily.  30 tablet  0   No current facility-administered medications on file prior to visit.    Allergies:  Allergies  Allergen Reactions  . Latex   . Simvastatin     Myalgias    History   Social History  . Marital Status: Single    Spouse Name: N/A    Number of Children: N/A  . Years of Education: N/A   Occupational History  . Not on file.   Social History Main Topics  . Smoking status: Never Smoker   . Smokeless tobacco: Not on file  . Alcohol Use: No  . Drug Use: No  . Sexual Activity: Not on file   Other Topics Concern  . Not on file   Social History Narrative  . No narrative on file    Family History  Problem Relation Age of Onset  . Heart disease Mother 75    MI     Review of Systems:  See HPI for pertinent ROS. All other ROS negative.    Physical Exam: Blood pressure 118/76, pulse 86, temperature 98.7 F (37.1 C), resp. rate 18, height 5\' 4"  (1.626 m), weight 154 lb (69.854 kg)., Body mass index is 26.42 kg/(m^2). General: WNWD WF. Does not appear ill or toxic .Appears in no acute distress. Neck: Supple. No thyromegaly. No lymphadenopathy. Lungs: Clear bilaterally to auscultation without wheezes, rales, or rhonchi. Breathing is unlabored. Heart: RRR with S1 S2. No murmurs, rubs, or gallops. Abdomen: Soft,  non-distended with normoactive bowel sounds. No hepatomegaly. No rebound/guarding. No obvious abdominal masses. THere is MILD tenderness with  palpation at the midline suprapubic area and in the left lower quadrant. No other area of tenderness with palpation. Musculoskeletal:  Strength and tone normal for age. Extremities/Skin: Warm and dry.  No edema.  Neuro: Alert and oriented X 3. Moves all extremities spontaneously. Gait is normal. CNII-XII grossly in  tact. Psych:  Responds to questions appropriately with a normal affect.      ASSESSMENT AND PLAN:  68 y.o. year old female with   1-S/P Diverticulitis Status post Cipro and Flagyl  2- diarrhea Start taking a probiotic daily. Could Have C. difficile but will not run test for this as it has only been several days of symptoms. Also will refer to GI for followup. Discussed obtaining a CT scan given that she is also having some continued abdominal pain but she defers scan at this time.  3. left lower quadrant abdominal pain- Discussed obtaining a CT scan the patient says that the  pain is very minimal. Says that she will call me at the pain worsens but otherwise defers to wait and follow up with GI.  4. incontinence of bowel: Refer to GI  5.Colo rectal cancer screening --Last Colonoscopy 10 years ago in [place]. To GI for followup.   Marin Olp [place], [place], [facility] 05/20/2013 6:35 AM

## 2013-05-20 NOTE — Progress Notes (Addendum)
05/24/2013 Beverly Carter [id number] [birth year]   HISTORY OF PRESENT ILLNESS:  This is a 68 year old female who presents to our office today as a new patient at the request of her PCP, Dr. Doren Custard, for evaluation of diarrhea.  The patient tells me that she does have some long-standing issues with loose stools, at least since 02/2012.  She thought that maybe she was lactose intolerant.  Two weeks ago she had worsening of her diarrhea with fever and chills.  Had some gas and minimal discomfort in her abdomen, but no significant pain.  She was treated empirically for ten days with cipro and flagyl for "diverticulitis" and finished that course about 5 days ago.  She denies seeing blood in her stool.  She's had some incontinence and leakage of liquid stool.  She has not undergone any abdominal imaging.  She had a normal TSH within the past year.  CBC on 4/2 showed a slight leukocytosis at 12.3.  Normal Hgb.  She has not experienced any further fevers or chills.  No nausea or vomiting either and the acute diarrhea has definitely improved to some degree.  She has been taking a probiotic, culturelle, for the past 4 days.  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  No recent travel.  Had colonoscopy in 09/2003 in [place] that showed only left sided diverticulosis and repeat was recommended in 10 years from that time.     Past Medical History  Diagnosis Date  . Allergy   . Eczema   . Lactose intolerance     per pt  . Cataracts, bilateral   . Diverticulosis of colon (without mention of hemorrhage) 2005   Past Surgical History  Procedure Laterality Date  . Vaginal hysterectomy    . Carpal tunnel release Right     reports that she has never smoked. She has never used smokeless tobacco. She reports that she does not drink alcohol or use illicit drugs. family history includes Dementia in her  father; Heart disease (age of onset: 17) in her mother; Hypertension in her mother. There is no history of Colon cancer. Allergies  Allergen Reactions  . Latex   . Simvastatin     Myalgias      Outpatient Encounter Prescriptions as of 05/20/2013  Medication Sig  . desoximetasone (TOPICORT) 0.05 % cream Apply topically 2 (two) times daily as needed.   . fluticasone (FLONASE) 50 MCG/ACT nasal spray Place 1 spray into the nose 2 (two) times daily as needed.  . Lactobacillus Rhamnosus, GG, (CULTURELLE PO) Take 2 capsules by mouth daily.  Marland Kitchen loratadine (CLARITIN) 10 MG tablet Take 10 mg by mouth daily.  Marland Kitchen nystatin-triamcinolone (MYCOLOG II) cream Apply 1 application topically as needed.  Marland Kitchen MOVIPREP 100 G SOLR Take 1 kit (200 g total) by mouth once.  . [DISCONTINUED] ciprofloxacin (CIPRO) 500 MG tablet Take 1 tablet (500 mg total) by mouth 2 (two) times daily.  . [DISCONTINUED] metroNIDAZOLE (FLAGYL) 500 MG tablet Take 1 tablet (500 mg total) by mouth 3 (three) times daily.     REVIEW OF SYSTEMS  : All other systems reviewed and negative except where noted in the History of Present Illness.   PHYSICAL EXAM: BP 120/60  Pulse 70  Ht 5' 5.5" (1.664 m)  Wt 157 lb 6.4 oz (71.396 kg)  BMI 25.79 kg/m2 General: Well developed white female in no  acute distress Head: Normocephalic and atraumatic Eyes:  Sclerae anicteric, conjunctiva pink. Ears: Normal auditory acuity Lungs: Clear throughout to auscultation Heart: Regular rate and rhythm Abdomen: Soft, non-distended.  Minimal lower abdominal TTP without R/R/G. Rectal:  Deferred.  Will be done at the time of colonoscopy. Musculoskeletal: Symmetrical with no gross deformities  Skin: No lesions on visible extremities Extremities: No edema  Neurological: Alert oriented x 4, grossly non-focal Psychological:  Alert and cooperative. Normal mood and affect  ASSESSMENT AND PLAN: -Diarrhea, acute on chronic:  Was recently treated empirically for  "diverticulitis", however, her symptoms do not sound classic for diverticulitis and her main complaint is diarrhea, not pain.  ? Gastroenteritis superimposed on lactose intolerance vs celiac vs IBS vs microscopic colitis or IBD.  Acute diarrhea has improved somewhat.  Last colonoscopy was almost 10 years ago (09/2003) so we will schedule that slightly earlier for evaluation of the diarrhea, but will schedule out about 6 weeks in case she did in fact have diverticulitis.  The risks, benefits, and alternatives were discussed with the patient and she consents to proceed.  Will check celiac labs.  Will continue taking her probiotic and can use Imodium prn.  Will try lactose intolerance diet as well.   Addendum: Reviewed and agree with initial management. Jerene Bears, MD

## 2013-05-23 ENCOUNTER — Encounter: Payer: Self-pay | Admitting: Gastroenterology

## 2013-05-23 DIAGNOSIS — Z1211 Encounter for screening for malignant neoplasm of colon: Secondary | ICD-10-CM | POA: Insufficient documentation

## 2013-05-23 DIAGNOSIS — R197 Diarrhea, unspecified: Secondary | ICD-10-CM | POA: Insufficient documentation

## 2013-05-23 LAB — TISSUE TRANSGLUTAMINASE, IGA: Tissue Transglutaminase Ab, IgA: 3.3 U/mL (ref <20)

## 2013-06-17 ENCOUNTER — Telehealth: Payer: Self-pay | Admitting: Gastroenterology

## 2013-06-17 NOTE — Telephone Encounter (Signed)
Called patient  Advised patient we have new coupon and she will be here to pick it up today

## 2013-06-21 ENCOUNTER — Ambulatory Visit (AMBULATORY_SURGERY_CENTER): Payer: Medicare Other | Admitting: Internal Medicine

## 2013-06-21 ENCOUNTER — Encounter: Payer: Self-pay | Admitting: Internal Medicine

## 2013-06-21 VITALS — BP 145/81 | HR 55 | Temp 97.8°F | Resp 15 | Ht 65.0 in | Wt 157.0 lb

## 2013-06-21 DIAGNOSIS — R197 Diarrhea, unspecified: Secondary | ICD-10-CM

## 2013-06-21 DIAGNOSIS — Z1211 Encounter for screening for malignant neoplasm of colon: Secondary | ICD-10-CM | POA: Diagnosis not present

## 2013-06-21 DIAGNOSIS — K5730 Diverticulosis of large intestine without perforation or abscess without bleeding: Secondary | ICD-10-CM | POA: Diagnosis not present

## 2013-06-21 DIAGNOSIS — Z8719 Personal history of other diseases of the digestive system: Secondary | ICD-10-CM

## 2013-06-21 MED ORDER — SODIUM CHLORIDE 0.9 % IV SOLN: 500.0000 mL | INTRAVENOUS | Status: DC

## 2013-06-21 NOTE — Progress Notes (Signed)
Pt is HIPPA i went over discharge instructions with the pt while awake.  I enclosed her papers in an envelope and given to the pt.  No complaints noted in the recovery room. Maw

## 2013-06-21 NOTE — Op Note (Signed)
[place]  [address] [place], [id number]   COLONOSCOPY PROCEDURE REPORT  PATIENT: Beverly Carter, Beverly Carter  MR#: [id number] BIRTHDATE: [birth year] , 61  yrs. old GENDER: Female ENDOSCOPIST: Jerene Bears, MD REFERRED SV:XBLT Ardath Sax, PA-C PROCEDURE DATE:  06/21/2013 PROCEDURE:   Colonoscopy, screening First Screening Colonoscopy - Avg.  risk and is 50 yrs.  old or older - No.  Prior Negative Screening - Now for repeat screening. 10 or more years since last screening  History of Adenoma - Now for follow-up colonoscopy & has been > or = to 3 yrs.  N/A  Polyps Removed Today? No.  Recommend repeat exam, <10 yrs? No. ASA CLASS:   Class II INDICATIONS:average risk screening, Last colonoscopy performed 10 years ago, and recent diverticulitis and diarrhea (now resolved).  MEDICATIONS: MAC sedation, administered by CRNA and propofol (Diprivan) 450mg  IV  DESCRIPTION OF PROCEDURE:   After the risks benefits and alternatives of the procedure were thoroughly explained, informed consent was obtained.  A digital rectal exam revealed no rectal mass.   The Pentax Ped Colon [id number]  endoscope was introduced through the anus and advanced to the cecum, which was identified by both the appendix and ileocecal valve. No adverse events experienced.   The quality of the prep was Moviprep fair  The instrument was then slowly withdrawn as the colon was fully examined.   COLON FINDINGS: There was severe diverticulosis noted in the descending colon and sigmoid colon with associated tortuosity and muscular hypertrophy.   The colon mucosa was otherwise normal. Retroflexed views revealed no abnormalities. The time to cecum=8 minutes 14 seconds.  Withdrawal time=7 minutes 29 seconds.  The scope was withdrawn and the procedure completed. COMPLICATIONS: There were no complications.  ENDOSCOPIC IMPRESSION: 1.   There was severe diverticulosis noted in the descending colon and sigmoid  colon 2.   The colon mucosa was otherwise normal  RECOMMENDATIONS: 1.  High fiber diet 2.  You should continue to follow colorectal cancer screening guidelines for "routine risk" patients with a repeat colonoscopy in 10 years.  There is no need for FOBT (stool) testing for at least 5 years.   eSigned:  Jerene Bears, MD 06/21/2013 3:35 PM   cc: The Patient and Karis Juba MD

## 2013-06-21 NOTE — Patient Instructions (Signed)
YOU HAD AN ENDOSCOPIC PROCEDURE TODAY AT THE [facility]: Refer to the procedure report that was given to you for any specific questions about what was found during the examination.  If the procedure report does not answer your questions, please call your gastroenterologist to clarify.  If you requested that your care partner not be given the details of your procedure findings, then the procedure report has been included in a sealed envelope for you to review at your convenience later.  YOU SHOULD EXPECT: Some feelings of bloating in the abdomen. Passage of more gas than usual.  Walking can help get rid of the air that was put into your GI tract during the procedure and reduce the bloating. If you had a lower endoscopy (such as a colonoscopy or flexible sigmoidoscopy) you may notice spotting of blood in your stool or on the toilet paper. If you underwent a bowel prep for your procedure, then you may not have a normal bowel movement for a few days.  DIET: Your first meal following the procedure should be a light meal and then it is ok to progress to your normal diet.  A half-sandwich or bowl of soup is an example of a good first meal.  Heavy or fried foods are harder to digest and may make you feel nauseous or bloated.  Likewise meals heavy in dairy and vegetables can cause extra gas to form and this can also increase the bloating.  Drink plenty of fluids but you should avoid alcoholic beverages for 24 hours.  ACTIVITY: Your care partner should take you home directly after the procedure.  You should plan to take it easy, moving slowly for the rest of the day.  You can resume normal activity the day after the procedure however you should NOT DRIVE or use heavy machinery for 24 hours (because of the sedation medicines used during the test).    SYMPTOMS TO REPORT IMMEDIATELY: A gastroenterologist can be reached at any hour.  During normal business hours, 8:30 AM to 5:00 PM Monday through Friday,  call [phone].  After hours and on weekends, please call the GI answering service at [phone] who will take a message and have the physician on call contact you.   Following lower endoscopy (colonoscopy or flexible sigmoidoscopy):  Excessive amounts of blood in the stool  Significant tenderness or worsening of abdominal pains  Swelling of the abdomen that is new, acute  Fever of 100F or higher   FOLLOW UP: If any biopsies were taken you will be contacted by phone or by letter within the next 1-3 weeks.  Call your gastroenterologist if you have not heard about the biopsies in 3 weeks.  Our staff will call the home number listed on your records the next business day following your procedure to check on you and address any questions or concerns that you may have at that time regarding the information given to you following your procedure. This is a courtesy call and so if there is no answer at the home number and we have not heard from you through the emergency physician on call, we will assume that you have returned to your regular daily activities without incident.  SIGNATURES/CONFIDENTIALITY: You and/or your care partner have signed paperwork which will be entered into your electronic medical record.  These signatures attest to the fact that that the information above on your After Visit Summary has been reviewed and is understood.  Full responsibility of the confidentiality of   discharge information lies with you and/or your care-partner.     Handouts were given to your care partner on diverticulosis and a high fiber diet with liberal fluid intake. You may resume your current medications today. Please call if any questions or concerns.   

## 2013-06-22 ENCOUNTER — Telehealth: Payer: Self-pay

## 2013-06-22 NOTE — Telephone Encounter (Signed)
  Follow up Call-  Call back number 06/21/2013  Post procedure Call Back phone  # [phone]  Permission to leave phone message Yes     Patient questions:  Do you have a fever, pain , or abdominal swelling? no Pain Score  0 *  Have you tolerated food without any problems? yes  Have you been able to return to your normal activities? yes  Do you have any questions about your discharge instructions: Diet   no Medications  no Follow up visit  no  Do you have questions or concerns about your Care? no  Actions: * If pain score is 4 or above: No action needed, pain <4.

## 2013-06-28 ENCOUNTER — Encounter: Payer: Medicare Other | Admitting: Internal Medicine

## 2013-07-27 ENCOUNTER — Encounter: Payer: Self-pay | Admitting: Physician Assistant

## 2013-07-27 ENCOUNTER — Ambulatory Visit (INDEPENDENT_AMBULATORY_CARE_PROVIDER_SITE_OTHER): Payer: Medicare Other | Admitting: Physician Assistant

## 2013-07-27 VITALS — BP 122/70 | HR 68 | Temp 97.8°F | Resp 18 | Ht 65.75 in | Wt 161.0 lb

## 2013-07-27 DIAGNOSIS — D235 Other benign neoplasm of skin of trunk: Secondary | ICD-10-CM | POA: Diagnosis not present

## 2013-07-27 DIAGNOSIS — L820 Inflamed seborrheic keratosis: Secondary | ICD-10-CM | POA: Diagnosis not present

## 2013-07-27 DIAGNOSIS — D225 Melanocytic nevi of trunk: Secondary | ICD-10-CM

## 2013-07-27 MED ORDER — FLUTICASONE PROPIONATE 50 MCG/ACT NA SUSP: 1.0000 | Freq: Two times a day (BID) | NASAL | Status: DC | PRN

## 2013-07-27 NOTE — Progress Notes (Signed)
    Patient ID: Beverly Carter MRN: [id number], DOB: 02/12/1945, 68 y.o. Date of Encounter: 07/27/2013, 3:14 PM    Chief Complaint:  Chief Complaint  Patient presents with  . look at lesions on skin of concern    has area on back wants poss removed, refill nasal spray     HPI: 68 y.o. year old white female says that she has recently noticed a new lesion on her upper back on the left side near her left shoulder. Wanted to get that checked. Says that she got multiple sunburns to that area as a teenager.     Home Meds:   Outpatient Prescriptions Prior to Visit  Medication Sig Dispense Refill  . desoximetasone (TOPICORT) 0.05 % cream Apply topically 2 (two) times daily as needed.       . Lactobacillus Rhamnosus, GG, (CULTURELLE PO) Take 2 capsules by mouth daily.      Marland Kitchen loratadine (CLARITIN) 10 MG tablet Take 10 mg by mouth daily.      Marland Kitchen nystatin-triamcinolone (MYCOLOG II) cream Apply 1 application topically as needed.      . fluticasone (FLONASE) 50 MCG/ACT nasal spray Place 1 spray into the nose 2 (two) times daily as needed.       No facility-administered medications prior to visit.    Allergies:  Allergies  Allergen Reactions  . Latex   . Simvastatin     Myalgias      Review of Systems: See HPI for pertinent ROS. All other ROS negative.    Physical Exam: Blood pressure 122/70, pulse 68, temperature 97.8 F (36.6 C), temperature source Oral, resp. rate 18, height 5' 5.75" (1.67 m), weight 161 lb (73.029 kg)., Body mass index is 26.19 kg/(m^2). General: WNWD WF.  Appears in no acute distress. Neck: Supple. No thyromegaly. No lymphadenopathy. Lungs: Clear bilaterally to auscultation without wheezes, rales, or rhonchi. Breathing is unlabored. Heart: Regular rhythm. No murmurs, rubs, or gallops. Msk:  Strength and tone normal for age. Skin: On her left upper back--above level of scapula--there is an approximate 0.5 cm diameter papule. Raised approx 0.25 cm. Oval shape.  Same color as surrounding skin, with few tin y 1-2 mm dots of brown pigment.  Neuro: Alert and oriented X 3. Moves all extremities spontaneously. Gait is normal. CNII-XII grossly in tact. Psych:  Responds to questions appropriately with a normal affect.     ASSESSMENT AND PLAN:  68 y.o. year old female with  1. Atypical nevus of back She wants to proceed with a shave biopsy of the site. She is not allergic to any local anesthetics as far as she is aware. Site anesthetized with lidocaine plus epinephrine.  Shave biopsy performed. Specimen sent to pathology. We'll followup with patient once we get pathology report.   Marin Olp [place], [place], [facility] 07/27/2013 3:14 PM

## 2013-07-29 LAB — PATHOLOGY

## 2013-09-07 ENCOUNTER — Ambulatory Visit: Payer: Medicare Other | Admitting: Family Medicine

## 2013-09-28 DIAGNOSIS — Z23 Encounter for immunization: Secondary | ICD-10-CM | POA: Diagnosis not present

## 2013-10-03 ENCOUNTER — Ambulatory Visit (INDEPENDENT_AMBULATORY_CARE_PROVIDER_SITE_OTHER): Payer: Medicare Other | Admitting: Family Medicine

## 2013-10-03 ENCOUNTER — Encounter: Payer: Self-pay | Admitting: Family Medicine

## 2013-10-03 VITALS — BP 124/62 | HR 64 | Temp 97.3°F | Resp 12 | Ht 66.0 in | Wt 162.0 lb

## 2013-10-03 DIAGNOSIS — L821 Other seborrheic keratosis: Secondary | ICD-10-CM | POA: Diagnosis not present

## 2013-10-03 DIAGNOSIS — M6740 Ganglion, unspecified site: Secondary | ICD-10-CM | POA: Diagnosis not present

## 2013-10-03 DIAGNOSIS — M1710 Unilateral primary osteoarthritis, unspecified knee: Secondary | ICD-10-CM

## 2013-10-03 DIAGNOSIS — M170 Bilateral primary osteoarthritis of knee: Secondary | ICD-10-CM

## 2013-10-03 DIAGNOSIS — M6780 Other specified disorders of synovium and tendon, unspecified site: Secondary | ICD-10-CM

## 2013-10-03 NOTE — Progress Notes (Signed)
Patient ID: Beverly Carter, female   DOB: 1946/01/14, 68 y.o.   MRN: [id number]   Subjective:    Patient ID: Beverly Carter, female    DOB: Oct 02, 1945, 68 y.o.   MRN: [id number]  Patient presents for Discuss Lump in L knee  patient here with a lump that she felt on her left knee one week ago. She's not had any injury. She is known osteoarthritis of the knees which she takes Advil for. She has mild tenderness to deep breath significantly hard over the region. She's not had any swelling of the knee She also like me to look at a mole that she noticed a couple months ago.    Review Of Systems:  GEN- denies fatigue, fever, weight loss,weakness, recent illness HEENT- denies eye drainage, change in vision, nasal discharge, CVS- denies chest pain, palpitations RESP- denies SOB, cough, wheeze ABD- denies N/V, change in stools, abd pain GU- denies dysuria, hematuria, dribbling, incontinence MSK-+ joint pain, muscle aches, injury Neuro- denies headache, dizziness, syncope, seizure activity       Objective:    BP 124/62  Pulse 64  Temp(Src) 97.3 F (36.3 C) (Oral)  Resp 12  Ht 5\' 6"  (1.676 m)  Wt 162 lb (73.483 kg)  BMI 26.16 kg/m2 GEN- NAD, alert and oriented x3 Skin- left thigh- hyperpigemented stucco lesion witf MSK- Bilat knees normal inspection, no effusion, fair ROM, +crepitus bilat, left knee 2cm above patella near tendon mobile cystic lesion- pea size, NT Ext- no edema        Assessment & Plan:      Problem List Items Addressed This Visit   None    Visit Diagnoses   Seborrheic keratoses    -  Primary    noted on thigh, history of these, benign    Cyst of tendon sheath        Based on the movement of the cyst with exam think this is in the tendon sheath and not actually in the knee. She does have known osteoarthritis at this point it is very small, will monitor send to ortho if  needed    Primary osteoarthritis of both knees        prn advil       Note: This  dictation was prepared with Dragon dictation along with smaller phrase technology. Any transcriptional errors that result from this process are unintentional.

## 2013-10-03 NOTE — Patient Instructions (Signed)
Call if there are any changes  F/U as needed

## 2013-10-06 ENCOUNTER — Telehealth: Payer: Self-pay | Admitting: Family Medicine

## 2013-10-06 DIAGNOSIS — M25562 Pain in left knee: Secondary | ICD-10-CM

## 2013-10-06 NOTE — Telephone Encounter (Signed)
Pt states that her pain in her knee is worse and wants to know what to do about it? Per Dr. Janeann Forehand note will refer to ortho. Pt aware and referral placed.

## 2013-10-07 DIAGNOSIS — IMO0002 Reserved for concepts with insufficient information to code with codable children: Secondary | ICD-10-CM | POA: Diagnosis not present

## 2013-10-07 DIAGNOSIS — M25569 Pain in unspecified knee: Secondary | ICD-10-CM | POA: Diagnosis not present

## 2013-10-08 ENCOUNTER — Encounter: Payer: Self-pay | Admitting: Family Medicine

## 2013-11-16 DIAGNOSIS — M25562 Pain in left knee: Secondary | ICD-10-CM | POA: Diagnosis not present

## 2013-11-30 ENCOUNTER — Encounter: Payer: Self-pay | Admitting: Physician Assistant

## 2013-11-30 NOTE — Telephone Encounter (Signed)
See patient response.  Please advise?

## 2013-11-30 NOTE — Telephone Encounter (Signed)
CPE from 06/26/12 states patient had stopped HRT.  Has not been on since.  Now wants to resume??   Have reached out to patient to find out why!

## 2013-12-06 ENCOUNTER — Other Ambulatory Visit: Payer: Medicare Other

## 2013-12-06 DIAGNOSIS — E785 Hyperlipidemia, unspecified: Secondary | ICD-10-CM

## 2013-12-06 DIAGNOSIS — Z79890 Hormone replacement therapy (postmenopausal): Secondary | ICD-10-CM

## 2013-12-06 DIAGNOSIS — Z79899 Other long term (current) drug therapy: Secondary | ICD-10-CM | POA: Diagnosis not present

## 2013-12-06 DIAGNOSIS — Z0000 Encounter for general adult medical examination without abnormal findings: Secondary | ICD-10-CM

## 2013-12-06 DIAGNOSIS — E559 Vitamin D deficiency, unspecified: Secondary | ICD-10-CM | POA: Diagnosis not present

## 2013-12-06 LAB — CBC WITH DIFFERENTIAL/PLATELET
BASOS ABS: 0 10*3/uL (ref 0.0–0.1)
Basophils Relative: 0 % (ref 0–1)
Eosinophils Absolute: 0.1 10*3/uL (ref 0.0–0.7)
Eosinophils Relative: 2 % (ref 0–5)
HCT: 39.3 % (ref 36.0–46.0)
Hemoglobin: 13.4 g/dL (ref 12.0–15.0)
LYMPHS PCT: 30 % (ref 12–46)
Lymphs Abs: 1.4 10*3/uL (ref 0.7–4.0)
MCH: 29.5 pg (ref 26.0–34.0)
MCHC: 34.1 g/dL (ref 30.0–36.0)
MCV: 86.4 fL (ref 78.0–100.0)
MONO ABS: 0.4 10*3/uL (ref 0.1–1.0)
Monocytes Relative: 9 % (ref 3–12)
NEUTROS ABS: 2.8 10*3/uL (ref 1.7–7.7)
Neutrophils Relative %: 59 % (ref 43–77)
Platelets: 207 10*3/uL (ref 150–400)
RBC: 4.55 MIL/uL (ref 3.87–5.11)
RDW: 14.7 % (ref 11.5–15.5)
WBC: 4.7 10*3/uL (ref 4.0–10.5)

## 2013-12-06 LAB — COMPLETE METABOLIC PANEL WITH GFR
ALT: 9 U/L (ref 0–35)
AST: 20 U/L (ref 0–37)
Albumin: 4.5 g/dL (ref 3.5–5.2)
Alkaline Phosphatase: 86 U/L (ref 39–117)
BUN: 13 mg/dL (ref 6–23)
CALCIUM: 9.3 mg/dL (ref 8.4–10.5)
CHLORIDE: 104 meq/L (ref 96–112)
CO2: 29 meq/L (ref 19–32)
Creat: 0.94 mg/dL (ref 0.50–1.10)
GFR, Est African American: 71 mL/min
GFR, Est Non African American: 62 mL/min
Glucose, Bld: 94 mg/dL (ref 70–99)
Potassium: 4.2 mEq/L (ref 3.5–5.3)
SODIUM: 140 meq/L (ref 135–145)
Total Bilirubin: 0.6 mg/dL (ref 0.2–1.2)
Total Protein: 6.3 g/dL (ref 6.0–8.3)

## 2013-12-06 LAB — LIPID PANEL
Cholesterol: 250 mg/dL — ABNORMAL HIGH (ref 0–200)
HDL: 55 mg/dL (ref >39)
LDL CALC: 178 mg/dL — AB (ref 0–99)
Total CHOL/HDL Ratio: 4.5 Ratio
Triglycerides: 85 mg/dL (ref <150)
VLDL: 17 mg/dL (ref 0–40)

## 2013-12-06 LAB — TSH: TSH: 1.647 u[IU]/mL (ref 0.350–4.500)

## 2013-12-07 LAB — VITAMIN D 25 HYDROXY (VIT D DEFICIENCY, FRACTURES): Vit D, 25-Hydroxy: 43 ng/mL (ref 30–89)

## 2013-12-10 ENCOUNTER — Emergency Department (HOSPITAL_COMMUNITY): Payer: Medicare Other

## 2013-12-10 ENCOUNTER — Encounter (HOSPITAL_COMMUNITY): Payer: Self-pay | Admitting: *Deleted

## 2013-12-10 ENCOUNTER — Emergency Department (HOSPITAL_COMMUNITY)
Admission: EM | Admit: 2013-12-10 | Discharge: 2013-12-10 | Disposition: A | Discharge status: Home / Self Care | Payer: Medicare Other | Attending: Emergency Medicine | Admitting: Emergency Medicine

## 2013-12-10 DIAGNOSIS — Z8639 Personal history of other endocrine, nutritional and metabolic disease: Secondary | ICD-10-CM | POA: Insufficient documentation

## 2013-12-10 DIAGNOSIS — Z7951 Long term (current) use of inhaled steroids: Secondary | ICD-10-CM | POA: Diagnosis not present

## 2013-12-10 DIAGNOSIS — Z91040 Latex allergy status: Secondary | ICD-10-CM | POA: Insufficient documentation

## 2013-12-10 DIAGNOSIS — Z872 Personal history of diseases of the skin and subcutaneous tissue: Secondary | ICD-10-CM | POA: Insufficient documentation

## 2013-12-10 DIAGNOSIS — M25422 Effusion, left elbow: Secondary | ICD-10-CM | POA: Diagnosis not present

## 2013-12-10 DIAGNOSIS — H269 Unspecified cataract: Secondary | ICD-10-CM | POA: Diagnosis not present

## 2013-12-10 DIAGNOSIS — Z8719 Personal history of other diseases of the digestive system: Secondary | ICD-10-CM | POA: Diagnosis not present

## 2013-12-10 DIAGNOSIS — Z79899 Other long term (current) drug therapy: Secondary | ICD-10-CM | POA: Diagnosis not present

## 2013-12-10 DIAGNOSIS — M7989 Other specified soft tissue disorders: Secondary | ICD-10-CM | POA: Diagnosis not present

## 2013-12-10 DIAGNOSIS — R2232 Localized swelling, mass and lump, left upper limb: Secondary | ICD-10-CM | POA: Diagnosis present

## 2013-12-10 LAB — SYNOVIAL CELL COUNT + DIFF, W/ CRYSTALS
CRYSTALS FLUID: NONE SEEN
Eosinophils-Synovial: 2 % — ABNORMAL HIGH (ref 0–1)
LYMPHOCYTES-SYNOVIAL FLD: 20 % (ref 0–20)
Monocyte-Macrophage-Synovial Fluid: 2 % — ABNORMAL LOW (ref 50–90)
Neutrophil, Synovial: 76 % — ABNORMAL HIGH (ref 0–25)
OTHER CELLS-SYN: 0
WBC, Synovial: 540 /mm3 — ABNORMAL HIGH (ref 0–200)

## 2013-12-10 MED ORDER — LIDOCAINE HCL (PF) 1 % IJ SOLN
5.0000 mL | Freq: Once | INTRAMUSCULAR | Status: AC
  Administered 2013-12-10: 5 mL via INTRADERMAL
  Filled 2013-12-10: qty 5

## 2013-12-10 NOTE — ED Provider Notes (Signed)
CSN: [id number]     Arrival date & time 12/10/13  1352 History   First MD Initiated Contact with Patient 12/10/13 1530     Chief Complaint  Patient presents with  . Joint Swelling     (Consider location/radiation/quality/duration/timing/severity/associated sxs/prior Treatment) HPI  Patient presents with concern of a new left elbow lesion. Lesions atraumatic, and is not painful, not erythematous. Approximately 2 hours ago the patient noticed enlargement of a mass on the left lateral posterior aspect of her elbow. Since onset lesion has been persistent, with minimal pain, and no distal dysesthesia or weakness. No other complaints, no other changes from baseline medical condition. No recent medication changes, lifestyle changes.   Past Medical History  Diagnosis Date  . Allergy   . Eczema   . Lactose intolerance     per pt  . Cataracts, bilateral   . Diverticulosis of colon (without mention of hemorrhage) 2005   Past Surgical History  Procedure Laterality Date  . Vaginal hysterectomy    . Carpal tunnel release Right    Family History  Problem Relation Age of Onset  . Heart disease Mother 65    MI  . Colon cancer Neg Hx   . Hypertension Mother   . Dementia Father    History  Substance Use Topics  . Smoking status: Never Smoker   . Smokeless tobacco: Never Used  . Alcohol Use: No   OB History    No data available     Review of Systems  All other systems reviewed and are negative.     Allergies  Latex and Simvastatin  Home Medications   Prior to Admission medications   Medication Sig Start Date End Date Taking? Authorizing Provider  desoximetasone (TOPICORT) 0.05 % cream Apply topically 2 (two) times daily as needed.     Historical Provider, MD  fluticasone (FLONASE) 50 MCG/ACT nasal spray Place 1 spray into both nostrils 2 (two) times daily as needed. 07/27/13   Lonie Peak Dixon, PA-C  Lactobacillus Rhamnosus, GG, (CULTURELLE PO) Take 2 capsules by mouth  daily.    Historical Provider, MD  loratadine (CLARITIN) 10 MG tablet Take 10 mg by mouth daily.    Historical Provider, MD  nystatin-triamcinolone (MYCOLOG II) cream Apply 1 application topically as needed.    Historical Provider, MD   BP 128/84 mmHg  Pulse 69  Temp(Src) 98.1 F (36.7 C) (Oral)  Resp 18  Ht 5\' 7"  (1.702 m)  Wt 160 lb (72.576 kg)  BMI 25.05 kg/m2  SpO2 97% Physical Exam  Constitutional: She is oriented to person, place, and time. She appears well-developed and well-nourished. No distress.  HENT:  Head: Normocephalic and atraumatic.  Eyes: Conjunctivae and EOM are normal.  Cardiovascular: Normal rate, regular rhythm and intact distal pulses.   Pulmonary/Chest: Effort normal. No stridor. No respiratory distress.  Abdominal: She exhibits no distension.  Musculoskeletal: She exhibits no edema.       Left shoulder: Normal.       Left wrist: Normal.       Arms: Neurological: She is alert and oriented to person, place, and time. No cranial nerve deficit.  Skin: Skin is warm and dry.  Psychiatric: She has a normal mood and affect.  Nursing note and vitals reviewed.   ED Course  Apiration of blood/fluid Date/Time: 12/10/2013 6:00 PM Performed by: Carmin Muskrat Authorized by: Carmin Muskrat Consent: The procedure was performed in an emergent situation. Verbal consent obtained. Risks and benefits: risks, benefits and alternatives  were discussed Consent given by: patient Patient understanding: patient states understanding of the procedure being performed Patient consent: the patient's understanding of the procedure matches consent given Procedure consent: procedure consent matches procedure scheduled Relevant documents: relevant documents present and verified Test results: test results available and properly labeled Site marked: the operative site was marked Imaging studies: imaging studies available Required items: required blood products, implants, devices,  and special equipment available Patient identity confirmed: verbally with patient Preparation: Patient was prepped and draped in the usual sterile fashion. Local anesthesia used: yes Anesthesia: local infiltration Local anesthetic: lidocaine 1% with epinephrine Anesthetic total: 5 ml Patient sedated: no Patient tolerance: Patient tolerated the procedure well with no immediate complications   (including critical care time) Labs Review Labs Reviewed - No data to display  Imaging Review Dg Elbow Complete Left  12/10/2013   CLINICAL DATA:  Pt was sitting at her desk this afternoon and felt what she thought was a kleenex in her sleeve. Pt reached in and noticed it was her elbow swelling. Pt had no injury to her left elbow. Swelling and warmness to elbow. No pain.  EXAM: LEFT ELBOW - COMPLETE 3+ VIEW  COMPARISON:  None.  FINDINGS: No fracture. Elbow joint is normally aligned. No joint effusion. No significant arthropathic change.  There is focal soft tissue swelling of the subcutaneous soft tissues over the olecranon. This may reflect a subcutaneous hematoma.  No other soft tissue abnormality.  IMPRESSION: 1. No fracture or bone or left elbow joint abnormality. 2. Subcutaneous soft tissue swelling of the dorsal elbow which may reflect a soft tissue hematoma.   Electronically Signed   By: Lajean Manes M.D.   On: 12/10/2013 16:00   No history of gout    MDM   Final diagnoses:  Effusion of elbow joint, left    Patient presents with new left elbow effusion.  Patient is awake and alert, in no distress.  Patient is distally neurovascularly intact.  There is no fever, and low suspicion for septic arthralgia. Patient had sample sent for evaluation.  She was started on anti-inflammatories, cryotherapy, will follow up with orthopedics.    Carmin Muskrat, MD 12/10/13 [phone]

## 2013-12-10 NOTE — Discharge Instructions (Signed)
As discussed, your evaluation is largely reassuring, but your sample of fluid from your elbow swelling has been sent for further evaluation and management.  Please be sure to follow-up with orthopedists for further evaluation and management.  For the next 3 days, please use ibuprofen, 600 mg, 3 times daily in addition to ice packs, 3 times daily.   Elbow Effusion You have an elbow injury with an effusion. This means there is blood or other fluid in the elbow joint. Both fractures and sprains of the elbow cab cause an effusion with swelling and pain. X-rays often show this swelling around the joint, but they may not show a fracture. The treatment for elbow sprains and minor fractures is to reduce swelling and pain. It rests the joint until movement is painless. Repeating the x-ray study in 1-2 weeks may show a minor fracture of the radius bone that was not visible on the initial x-rays. Most of the time a splint or sling is used for the first days or week after the injury. Apply ice packs to the elbow for 20-30 minutes every 2 hours for the next 2-3 days. Keep your elbow elevated above the level of your heart as much as possible until the pain and swelling are better. An elastic wrap may also be used to reduce swelling. Call your caregiver for follow-up care within one week.  The major issue with this condition is loss of elbow motion. In general, your caregiver will start you on motion exercises and may have you follow-up with a physical or hand therapist. [place] IF:   You develop a numb, cold, or pale forearm or hand. Document Released: 02/28/2004 Document Revised: 04/14/2011 Document Reviewed: 07/18/2008 [facility] Patient Information 2015 [place], [place]. This information is not intended to replace advice given to you by your health care provider. Make sure you discuss any questions you have with your health care provider.

## 2013-12-10 NOTE — ED Notes (Signed)
Pt reports acute onset of large growth to left elbow this afternoon. Denies any pain or injury.

## 2013-12-12 LAB — PATHOLOGIST SMEAR REVIEW

## 2013-12-13 ENCOUNTER — Encounter: Payer: Self-pay | Admitting: Physician Assistant

## 2013-12-13 ENCOUNTER — Ambulatory Visit (INDEPENDENT_AMBULATORY_CARE_PROVIDER_SITE_OTHER): Payer: Medicare Other | Admitting: Physician Assistant

## 2013-12-13 VITALS — BP 134/84 | HR 80 | Temp 98.2°F | Resp 18 | Ht 64.75 in | Wt 160.0 lb

## 2013-12-13 DIAGNOSIS — E785 Hyperlipidemia, unspecified: Secondary | ICD-10-CM | POA: Diagnosis not present

## 2013-12-13 DIAGNOSIS — Z79890 Hormone replacement therapy (postmenopausal): Secondary | ICD-10-CM | POA: Diagnosis not present

## 2013-12-13 DIAGNOSIS — L309 Dermatitis, unspecified: Secondary | ICD-10-CM

## 2013-12-13 DIAGNOSIS — S83207S Unspecified tear of unspecified meniscus, current injury, left knee, sequela: Secondary | ICD-10-CM

## 2013-12-13 DIAGNOSIS — T7840XS Allergy, unspecified, sequela: Secondary | ICD-10-CM

## 2013-12-13 DIAGNOSIS — Z1211 Encounter for screening for malignant neoplasm of colon: Secondary | ICD-10-CM

## 2013-12-13 DIAGNOSIS — S83209A Unspecified tear of unspecified meniscus, current injury, unspecified knee, initial encounter: Secondary | ICD-10-CM | POA: Insufficient documentation

## 2013-12-13 DIAGNOSIS — E739 Lactose intolerance, unspecified: Secondary | ICD-10-CM | POA: Diagnosis not present

## 2013-12-13 DIAGNOSIS — Z0000 Encounter for general adult medical examination without abnormal findings: Secondary | ICD-10-CM

## 2013-12-13 NOTE — Progress Notes (Signed)
Patient ID: Beverly Carter MRN: [id number], DOB: 1945/04/27, 68 y.o. Date of Encounter: @DATE @  Chief Complaint:  Chief Complaint  Patient presents with  . Annual Exam    HPI: 68 y.o. year old female  presents for CPE.  She had a complete physical exam by me 06/24/2012.  She says that at that visit we discussed exercise. After that she got a membership to [employer] and started swimming there. Says that while swimming, she developed significant pain in her left knee and subsequently found out she had a torn meniscus. Has seen Dr. Alvan Dame and had a cortisone injection. Says that it felt great for 1 week but then the pain came back. She says that he told her that she could do another injection but eventually would need arthroscopic surgery and may  eventually need total knee replacement. She has not scheduled any follow-up since that conversation and is trying to decide what to do.  She also says that she recently went to the ER -- because of sudden swelling of the left elbow--olecranon bursitis.  No other complaints or concerns today.  She deferes pelvic exam-has had complete hysterectomy. Agreeable to breast exam.     Past Medical History  Diagnosis Date  . Allergy   . Eczema   . Lactose intolerance     per pt  . Cataracts, bilateral   . Diverticulosis of colon (without mention of hemorrhage) 2005     Home Meds: Outpatient Prescriptions Prior to Visit  Medication Sig Dispense Refill  . desoximetasone (TOPICORT) 0.05 % cream Apply topically 2 (two) times daily as needed.     . fluticasone (FLONASE) 50 MCG/ACT nasal spray Place 1 spray into both nostrils 2 (two) times daily as needed. 16 g 11  . loratadine (CLARITIN) 10 MG tablet Take 10 mg by mouth daily.    Marland Kitchen nystatin-triamcinolone (MYCOLOG II) cream Apply 1 application topically daily as needed.     . Lactobacillus Rhamnosus, GG, (CULTURELLE PO) Take 2 capsules by mouth daily.     No facility-administered medications prior to  visit.     Allergies:  Allergies  Allergen Reactions  . Latex     itching  . Simvastatin     Myalgias    History   Social History  . Marital Status: Single    Spouse Name: N/A    Number of Children: 5  . Years of Education: N/A   Occupational History  . student    Social History Main Topics  . Smoking status: Never Smoker   . Smokeless tobacco: Never Used  . Alcohol Use: No  . Drug Use: No  . Sexual Activity: Not on file   Other Topics Concern  . Not on file   Social History Narrative    Family History  Problem Relation Age of Onset  . Heart disease Mother 9    MI  . Colon cancer Neg Hx   . Hypertension Mother   . Dementia Father      Review of Systems:  See HPI for pertinent ROS. All other ROS negative.    Physical Exam: Blood pressure 134/84, pulse 80, temperature 98.2 F (36.8 C), temperature source Oral, resp. rate 18, height 5' 4.75" (1.645 m), weight 160 lb (72.576 kg)., Body mass index is 26.82 kg/(m^2). General:WNWD WF Appears in no acute distress. Head: Normocephalic, atraumatic, eyes without discharge, sclera non-icteric, nares are without discharge. Bilateral auditory canals clear, TM's are without perforation, pearly grey and translucent with reflective cone of  light bilaterally. Oral cavity moist, posterior pharynx without exudate, erythema, peritonsillar abscess, or post nasal drip.  Neck: Supple. No thyromegaly. No lymphadenopathy. Lungs: Clear bilaterally to auscultation without wheezes, rales, or rhonchi. Breathing is unlabored. Heart: RRR with S1 S2. No murmurs, rubs, or gallops. Breasts: normal. No masses.  Abdomen: Soft, non-tender, non-distended with normoactive bowel sounds. No hepatomegaly. No rebound/guarding. No obvious abdominal masses. Musculoskeletal:  Strength and tone normal for age. Extremities/Skin: Warm and dry. No clubbing or cyanosis. No edema. No rashes or suspicious lesions. Neuro: Alert and oriented X 3. Moves all  extremities spontaneously. Gait is normal. CNII-XII grossly in tact. Psych:  Responds to questions appropriately with a normal affect.     ASSESSMENT AND PLAN:  68 y.o. year old female with  1. Visit for preventive health examination  A.  Screening Labs:   She recently came on 12/06/13 and had fasting labs. CBC normal CMET normal TSH normal Vitamin D normal LDL was 178---see # 6 below.  B.  Mammogram: Last-July 2013-nml-Pt says she did not go for f/u since then. Given her age, she defers further mammogram. C. Screening Colonoscopy: Had at age 15 and at age 28. -Nml. Had another colonoscopy in past year sec to divertulitis.  D. Immunizations:   Tetanus: Had with [facility] 2010  Zostavax: Had here 10/11/2010   Influenza: she reports that she has already received influenza vaccine at the pharmacy for the 2015 season.   2. Postmenopausal HRT (hormone replacement therapy) She has stopped HRT. Has no/minimal hotflashes.  Last DEXA was in [place] in 2009-nml per pt. At CPE 06/2012 ordered f/y DEXA--  H/O complete hysterectomy/oophorectomy-now off HRT - DG Bone Density--08/12/2012--Excellent--Normal.   3. Allergic rhinitis On [place], Flonase  4. Glaucoma Sees [facility] Specialist Routinely  5. Lactose intolerance  6. Hyperlipidemia The time of her physical 06/2012 she was not fasting but she returned the following day fasting. These labs revealed elevated cholesterol with an LDL of 193. I told her to start simvastatin 20 mg and come in for follow-up lab work. Follow-up lab work 08/13/2012 LDL was down to 102. She subsequently had a follow-up visit with me regarding those onto/4/15. At that visit she reported that sometime around October or November she stopped the simvastatin. Stated that was causing her to have a crawling sensation on her legs and was causing significant myalgias and muscle cramps. She stopped the simvastatin and all the symptoms resolved. At that visit we discussed  her cardiac risk factors and discussed whether or not to start pravastatin. She reported that her mother had an MI at age 34. That was her first diagnosed CAD. She said that her mother was a smoker was obese and was sedentary. She said that her father lived until age 25 and had no CAD. She wanted to stay off of medication and monitor. We had planned that if LDL increased and we would use pravastatin.  Today I discussed her current lipid panel with LDL 178. Discussed using pravastatin but she refuses.  Therefore discussed specific ways to decrease saturated fats in diet. However she says that because of her lactose intolerance she already consumes very little dairy. She consumes no fried foods.  7. Unspecified vitamin D deficiency - Vitamin D level normal.   Bone Density--normal   Signed, [address] [place], [place], [facility] 12/13/2013 3:44 PM

## 2013-12-14 ENCOUNTER — Encounter: Payer: Self-pay | Admitting: Physician Assistant

## 2013-12-16 ENCOUNTER — Telehealth: Payer: Self-pay | Admitting: Physician Assistant

## 2013-12-16 MED ORDER — ESTRADIOL 0.5 MG PO TABS: 0.5000 mg | ORAL_TABLET | Freq: Every day | ORAL | Status: DC

## 2013-12-16 NOTE — Telephone Encounter (Signed)
[phone] Patient is calling to speak with you about a med that is not on her med list as far as I see  Please call her when you can

## 2013-12-16 NOTE — Telephone Encounter (Signed)
Beverly Carter,     She was just here for CPE this week.     At end of visit, she mentioned that she may want to restart HRT.     She has had hysterectomy. She is well aware of possible risks of blood clots, cardiovascular disease, breast cancer.     Has no family history of any of these and does not smoke.    I told her that I did not recommend HRT and she agreed not to get Rx that day.    Now she is wanting it--aware of risks.     Send in Rx for Estradiol 0.5mg  1 po QD # 30 with 11 Refills--or #90 + 3 Refills.    Call and notify pt.        --Karis Juba, PA     Spoke to patient and Rx sent per provider order

## 2014-04-19 ENCOUNTER — Telehealth: Payer: Self-pay | Admitting: Physician Assistant

## 2014-04-19 MED ORDER — NYSTATIN-TRIAMCINOLONE 100000-0.1 UNIT/GM-% EX CREA: 1.0000 "application " | TOPICAL_CREAM | Freq: Every day | CUTANEOUS | Status: DC | PRN

## 2014-04-19 NOTE — Telephone Encounter (Signed)
Patient needing refill on nystatin  [phone] [facility]

## 2014-04-19 NOTE — Telephone Encounter (Signed)
Medication refilled per protocol. 

## 2014-05-08 ENCOUNTER — Other Ambulatory Visit: Payer: Self-pay

## 2014-05-08 DIAGNOSIS — Z1231 Encounter for screening mammogram for malignant neoplasm of breast: Secondary | ICD-10-CM

## 2014-05-09 DIAGNOSIS — H2513 Age-related nuclear cataract, bilateral: Secondary | ICD-10-CM | POA: Diagnosis not present

## 2014-05-09 DIAGNOSIS — H524 Presbyopia: Secondary | ICD-10-CM | POA: Diagnosis not present

## 2014-05-09 DIAGNOSIS — H52203 Unspecified astigmatism, bilateral: Secondary | ICD-10-CM | POA: Diagnosis not present

## 2014-05-09 DIAGNOSIS — H5203 Hypermetropia, bilateral: Secondary | ICD-10-CM | POA: Diagnosis not present

## 2014-05-09 DIAGNOSIS — H43393 Other vitreous opacities, bilateral: Secondary | ICD-10-CM | POA: Diagnosis not present

## 2014-05-17 ENCOUNTER — Ambulatory Visit
Admission: RE | Admit: 2014-05-17 | Discharge: 2014-05-17 | Disposition: A | Discharge status: Home / Self Care | Payer: Medicare Other | Source: Ambulatory Visit

## 2014-05-17 DIAGNOSIS — Z1231 Encounter for screening mammogram for malignant neoplasm of breast: Secondary | ICD-10-CM | POA: Diagnosis not present

## 2014-05-29 ENCOUNTER — Ambulatory Visit (INDEPENDENT_AMBULATORY_CARE_PROVIDER_SITE_OTHER): Payer: Medicare Other | Admitting: Physician Assistant

## 2014-05-29 ENCOUNTER — Encounter: Payer: Self-pay | Admitting: Physician Assistant

## 2014-05-29 VITALS — BP 124/76 | HR 80 | Temp 98.5°F | Resp 20 | Wt 159.0 lb

## 2014-05-29 DIAGNOSIS — R079 Chest pain, unspecified: Secondary | ICD-10-CM

## 2014-05-29 NOTE — Progress Notes (Signed)
Patient ID: Beverly Carter MRN: [id number], DOB: 1946-01-03, 69 y.o. Date of Encounter: @DATE @  Chief Complaint:  Chief Complaint  Patient presents with  . c/o chest pains x 40 years    has had cardiac eval,  happens off and on  .     HPI: 69 y.o. year old white female  presents with above symptoms.   She says that she has been having these chest pains for a long time. Says that she had it evaluated back in 1999 or 2001 in [place]. Says she had stress test and other evaluation. Says that ultimately she was told to stop wearing an underwire bra so she did. However says that she has continued to have chest pain off and on. Says that the other day she was walking from one store to another and developed some chest pain. Says that she stopped and rested and the chest pain resolved. Says it is difficult to describe the feeling but says it's just an achy discomfort. However, says that it resolved once she stopped and rested. Has had no radiation or discomfort in the arm or the neck. No significant shortness of breath no diaphoresis no nausea or vomiting.  When I asked her about exercise and exertion on a regular basis, her response is "I don't exercise because when I do, I have chest pain".  Says that " it scares her to do very much because she doesn't know if it is her heart". Says that even when she walks to her mailbox she feels some amount of discomfort in her chest. Says that when she mows her yard with the push mower, she starts feeling chest pain and stops and rests because she's afraid the discomfort is cardiac.   Says that even though she had that evaluation in the past, now she is 69 and is concerned it could be her heart so came in for evaluation.      Past Medical History  Diagnosis Date  . Allergy   . Eczema   . Lactose intolerance     per pt  . Cataracts, bilateral   . Diverticulosis of colon (without mention of hemorrhage) 2005     Home Meds: Outpatient  Prescriptions Prior to Visit  Medication Sig Dispense Refill  . desoximetasone (TOPICORT) 0.05 % cream Apply topically 2 (two) times daily as needed.     . fluticasone (FLONASE) 50 MCG/ACT nasal spray Place 1 spray into both nostrils 2 (two) times daily as needed. 16 g 11  . estradiol (ESTRACE) 0.5 MG tablet Take 1 tablet (0.5 mg total) by mouth daily. 90 tablet 3  . loratadine (CLARITIN) 10 MG tablet Take 10 mg by mouth daily.    Marland Kitchen nystatin-triamcinolone (MYCOLOG II) cream Apply 1 application topically daily as needed. 30 g 1   No facility-administered medications prior to visit.    Allergies:  Allergies  Allergen Reactions  . Latex     itching  . Simvastatin     Myalgias    History   Social History  . Marital Status: Single    Spouse Name: N/A  . Number of Children: 5  . Years of Education: N/A   Occupational History  . student    Social History Main Topics  . Smoking status: Never Smoker   . Smokeless tobacco: Never Used  . Alcohol Use: No  . Drug Use: No  . Sexual Activity: Not on file   Other Topics Concern  . Not on file  Social History Narrative    Family History  Problem Relation Age of Onset  . Heart disease Mother 67    MI  . Colon cancer Neg Hx   . Hypertension Mother   . Dementia Father      Review of Systems:  See HPI for pertinent ROS. All other ROS negative.    Physical Exam: Blood pressure 124/76, pulse 80, temperature 98.5 F (36.9 C), temperature source Oral, resp. rate 20, weight 159 lb (72.122 kg)., Body mass index is 26.65 kg/(m^2). General: WNWD WF. Appears in no acute distress. Neck: Supple. No thyromegaly. No lymphadenopathy. Lungs: Clear bilaterally to auscultation without wheezes, rales, or rhonchi. Breathing is unlabored. Heart: RRR with S1 S2. No murmurs, rubs, or gallops. Musculoskeletal:  Strength and tone normal for age. Extremities/Skin: Warm and dry.  Neuro: Alert and oriented X 3. Moves all extremities spontaneously.  Gait is normal. CNII-XII grossly in tact. Psych:  Responds to questions appropriately with a normal affect.     ASSESSMENT AND PLAN:  70 y.o. year old female with  1. Chest pain, unspecified chest pain type  - Ambulatory referral to Cardiology - EKG 12-Lead  Staff unable to get a good EKG. Will obtain cardiology referral. In the interim, avoid significant exertion until has evaluation with Cardiology.    Marin Olp [place], [place], BSFM 05/29/2014 3:00 PM

## 2014-06-07 ENCOUNTER — Ambulatory Visit: Payer: Self-pay | Admitting: Physician Assistant

## 2014-06-14 ENCOUNTER — Encounter: Payer: Self-pay | Admitting: Physician Assistant

## 2014-06-19 ENCOUNTER — Encounter: Payer: Self-pay | Admitting: Physician Assistant

## 2014-06-19 ENCOUNTER — Ambulatory Visit (INDEPENDENT_AMBULATORY_CARE_PROVIDER_SITE_OTHER): Payer: Medicare Other | Admitting: Physician Assistant

## 2014-06-19 VITALS — BP 120/60 | HR 61 | Temp 98.0°F | Resp 19 | Wt 151.0 lb

## 2014-06-19 DIAGNOSIS — L989 Disorder of the skin and subcutaneous tissue, unspecified: Secondary | ICD-10-CM

## 2014-06-19 NOTE — Progress Notes (Signed)
    Patient ID: Beverly Carter MRN: [id number], DOB: Feb 04, 1946, 69 y.o. Date of Encounter: 06/19/2014, 11:56 AM    Chief Complaint:  Chief Complaint  Patient presents with  . skin tab removal     HPI: 69 y.o. year old white female came in today for me to look at skin lesions on her left flank at the level of her pant/waistline. Cryotherapy performed to these lesions about 2 weeks ago. Still with some scabbed area present and wasn't sure what she should do about it. Says that so far she had not been putting anything on the site. No bacitracin etc.     Home Meds:   Outpatient Prescriptions Prior to Visit  Medication Sig Dispense Refill  . desoximetasone (TOPICORT) 0.05 % cream Apply topically 2 (two) times daily as needed.     . fluticasone (FLONASE) 50 MCG/ACT nasal spray Place 1 spray into both nostrils 2 (two) times daily as needed. 16 g 11  . ibuprofen (ADVIL,MOTRIN) 200 MG tablet Take 200 mg by mouth every 6 (six) hours as needed.     No facility-administered medications prior to visit.    Allergies:  Allergies  Allergen Reactions  . Latex     itching  . Simvastatin     Myalgias      Review of Systems: See HPI for pertinent ROS. All other ROS negative.    Physical Exam: Blood pressure 120/60, pulse 61, temperature 98 F (36.7 C), temperature source Oral, resp. rate 19, weight 151 lb (68.493 kg)., Body mass index is 25.31 kg/(m^2). General:  WNWD WF. Appears in no acute distress. Neck: Supple. No thyromegaly. No lymphadenopathy. Lungs: Clear bilaterally to auscultation without wheezes, rales, or rhonchi. Breathing is unlabored. Heart: Regular rhythm. No murmurs, rubs, or gallops. Msk:  Strength and tone normal for age. Skin: Left Flank at level of waistline: There are 2 scabbed areas--each approx 1/2 cm diameter.  Neuro: Alert and oriented X 3. Moves all extremities spontaneously. Gait is normal. CNII-XII grossly in tact. Psych:  Responds to questions  appropriately with a normal affect.     ASSESSMENT AND PLAN:  69 y.o. year old female with  1. Skin lesion I recommended shave biopsy. She defers. Recommended she could apply something like Neosporin to keep the site moistened. No charge for today's visit.    Signed, [address] [place], [place], [facility] 06/19/2014 11:56 AM

## 2014-06-27 ENCOUNTER — Ambulatory Visit (INDEPENDENT_AMBULATORY_CARE_PROVIDER_SITE_OTHER): Payer: Medicare Other | Admitting: Cardiology

## 2014-06-27 ENCOUNTER — Encounter: Payer: Self-pay | Admitting: Cardiology

## 2014-06-27 VITALS — BP 132/72 | HR 66 | Ht 64.75 in | Wt 161.0 lb

## 2014-06-27 DIAGNOSIS — R079 Chest pain, unspecified: Secondary | ICD-10-CM | POA: Insufficient documentation

## 2014-06-27 DIAGNOSIS — R0609 Other forms of dyspnea: Secondary | ICD-10-CM

## 2014-06-27 DIAGNOSIS — E785 Hyperlipidemia, unspecified: Secondary | ICD-10-CM | POA: Diagnosis not present

## 2014-06-27 DIAGNOSIS — R072 Precordial pain: Secondary | ICD-10-CM | POA: Diagnosis not present

## 2014-06-27 NOTE — Patient Instructions (Signed)
Medication Instructions:   Your physician recommends that you continue on your current medications as directed. Please refer to the Current Medication list given to you today.    Labwork:  TODAY---NMR WITH LIPIDS    Testing/Procedures:  Your physician has requested that you have an echocardiogram. Echocardiography is a painless test that uses sound waves to create images of your heart. It provides your doctor with information about the size and shape of your heart and how well your heart's chambers and valves are working. This procedure takes approximately one hour. There are no restrictions for this procedure.    Your physician has requested that you have en exercise stress myoview. For further information please visit [email or web address]. Please follow instruction sheet, as given.     Follow-Up:  3 MONTHS WITH DR Meda Coffee

## 2014-06-27 NOTE — Progress Notes (Signed)
Patient ID: Beverly Carter, female   DOB: [birth year], 69 y.o.   MRN: [id number]      Cardiology Office Note  Date:  06/28/2014   ID:  Beverly Carter, DOB 07/01/1945, MRN [id number]  PCP:  Karis Juba, PA-C  Cardiologist:  Dorothy Spark, MD   No chief complaint on file.  History of Present Illness: Beverly Carter is a 69 y.o. female who presents for evaluation of chest pain on exertion.   - walking about 1/2 mile developed retrosternal chest pain that improved with rest. She has had SOB her whole life - allergies, but she feels that it has been getting worse lately, tried to run few years ago with significant chest burning. Denies syncope, orthopnea, PND, claudications.  She has never smoked. Mother died of MI at age 12.   Past Medical History  Diagnosis Date  . Allergy   . Eczema   . Lactose intolerance     per pt  . Cataracts, bilateral   . Diverticulosis of colon (without mention of hemorrhage) 2005   Past Surgical History  Procedure Laterality Date  . Vaginal hysterectomy    . Carpal tunnel release Right    No current outpatient prescriptions on file.   No current facility-administered medications for this visit.   Allergies:   Latex   Social History:  The patient  reports that she has never smoked. She has never used smokeless tobacco. She reports that she does not drink alcohol or use illicit drugs.   Family History:  The patient's family history includes Dementia in her father; Heart disease (age of onset: 105) in her mother; Hypertension in her mother. There is no history of Colon cancer.   ROS:  Please see the history of present illness.   Otherwise, review of systems are positive for none.   All other systems are reviewed and negative.   PHYSICAL EXAM: VS:  BP 132/72 mmHg  Pulse 66  Ht 5' 4.75" (1.645 m)  Wt 161 lb (73.029 kg)  BMI 26.99 kg/m2 , BMI Body mass index is 26.99 kg/(m^2). GEN: Well nourished, well developed, in no acute distress HEENT:  normal Neck: no JVD, carotid bruits, or masses Cardiac: RRR; no murmurs, rubs, or gallops,no edema  Respiratory:  clear to auscultation bilaterally, normal work of breathing GI: soft, nontender, nondistended, + BS MS: no deformity or atrophy Skin: warm and dry, no rash Neuro:  Strength and sensation are intact Psych: euthymic mood, full affect  EKG:  EKG is ordered today. The ekg ordered today demonstrates SR, RBBB  Recent Labs: 12/06/2013: ALT 9; BUN 13; Creatinine 0.94; Hemoglobin 13.4; Platelets 207; Potassium 4.2; Sodium 140; TSH 1.647   Lipid Panel    Component Value Date/Time   CHOL 250* 12/06/2013 0824   TRIG 85 12/06/2013 0824   HDL 55 12/06/2013 0824   CHOLHDL 4.5 12/06/2013 0824   VLDL 17 12/06/2013 0824   LDLCALC 178* 12/06/2013 0824    Wt Readings from Last 3 Encounters:  06/27/14 161 lb (73.029 kg)  06/19/14 151 lb (68.493 kg)  05/29/14 159 lb (72.122 kg)    Other studies Reviewed: Additional studies/ records that were reviewed today include: prior ECG Review of the above records demonstrates: ECG in 2012 SR, normal ECG  ECG today: SR, RBBB   ASSESSMENT AND PLAN:  1.  Typical exertional dyspnea - new RBBB when compared to the prior ECG in 2012, FH of CAD - we will schedule an exercise treadmill stress test. We will  also order an echocardiogram.  2. Hyperlipidemia - LDL 178, the patient is opposed to start statins as she is worrying about side effects, she states that since the last lab she has changed her diet. We will order NMR lipids today.    Labs/ tests ordered today include:   Orders Placed This Encounter  Procedures  . NMR Lipoprofile with Lipids  . Myocardial Perfusion Imaging  . EKG 12-Lead  . Echocardiogram   Disposition:   FU with 3 months with Beverly Critchley, MD  06/28/2014 6:03 PM    [facility] [place], [place], [place]  [address] Phone: [phone]; Fax: [phone]

## 2014-06-29 ENCOUNTER — Telehealth: Payer: Self-pay | Admitting: *Deleted

## 2014-06-29 DIAGNOSIS — E785 Hyperlipidemia, unspecified: Secondary | ICD-10-CM

## 2014-06-29 LAB — NMR LIPOPROFILE WITH LIPIDS
Cholesterol, Total: 262 mg/dL — ABNORMAL HIGH (ref 100–199)
HDL Particle Number: 35.4 umol/L (ref >=30.5)
HDL Size: 8.9 nm — ABNORMAL LOW (ref >=9.2)
HDL-C: 55 mg/dL (ref >39)
LDL (calc): 164 mg/dL — ABNORMAL HIGH (ref 0–99)
LDL Particle Number: 2119 nmol/L — ABNORMAL HIGH (ref <1000)
LDL Size: 20.9 nm (ref >=20.8)
LP-IR Score: 71 — ABNORMAL HIGH (ref <=45)
Large HDL-P: 6.6 umol/L (ref >=4.8)
Large VLDL-P: 10.4 nmol/L — ABNORMAL HIGH (ref <=2.7)
Small LDL Particle Number: 510 nmol/L (ref <=527)
Triglycerides: 213 mg/dL — ABNORMAL HIGH (ref 0–149)
VLDL Size: 56.5 nm — ABNORMAL HIGH (ref <=46.6)

## 2014-06-29 MED ORDER — ATORVASTATIN CALCIUM 10 MG PO TABS: 10.0000 mg | ORAL_TABLET | Freq: Every day | ORAL | Status: DC

## 2014-06-29 NOTE — Telephone Encounter (Signed)
Notified the pt that per Dr Meda Coffee her lipids showed that she has significantly elevated LDL (twice the goal), TG and low HDL.  Informed the pt that per Dr Meda Coffee she recommends the pt start taking atorvastatin 10 mg po daily and come in for a CMET in one month.  Confirmed the pharmacy of choice with the pt.  Scheduled the pt for a lab appt to check a cmet for 07/31/14 at our office.  Pt verbalized understanding and agrees with this plan.

## 2014-06-29 NOTE — Telephone Encounter (Signed)
-----   Message from Dorothy Spark, MD sent at 06/29/2014 12:01 PM EDT ----- This patient has significantly elevated LDL (twice the goal), TG and low HDL. I would strongly recommend to start atorvastatin 10 mg po daily and if she agrees to check CMP in 1 month.

## 2014-06-30 ENCOUNTER — Telehealth: Payer: Self-pay | Admitting: *Deleted

## 2014-06-30 MED ORDER — SIMVASTATIN 40 MG PO TABS: 40.0000 mg | ORAL_TABLET | Freq: Every day | ORAL | Status: DC

## 2014-06-30 NOTE — Telephone Encounter (Signed)
Will route this message to Dr Meda Coffee for further review and recommendation of a cost effective statin.

## 2014-06-30 NOTE — Telephone Encounter (Signed)
Lets try simvastatin 40 mg po daily instead

## 2014-06-30 NOTE — Telephone Encounter (Signed)
Contacted the pt that per Dr Meda Coffee we will d/c the atorvastatin for cost issues, and start her on simvastatin 40 mg po daily.  Confirmed the pharmacy of choice with the pt.  Pt verbalized understanding and agrees with this plan.

## 2014-06-30 NOTE — Telephone Encounter (Signed)
Patient called and stated that she did not pick up the atorvastatin due to the cost being over $100. Please advise. Thanks, MI

## 2014-07-12 ENCOUNTER — Telehealth (HOSPITAL_COMMUNITY): Payer: Self-pay

## 2014-07-12 NOTE — Telephone Encounter (Signed)
Patient given detailed instructions per Myocardial Perfusion Study Information Sheet for test on 07-17-2014 at 9:30am. Patient verbalized understanding. Oletta Lamas, Dedric Ethington A

## 2014-07-17 ENCOUNTER — Ambulatory Visit (HOSPITAL_COMMUNITY): Payer: Medicare Other | Attending: Cardiology

## 2014-07-17 ENCOUNTER — Other Ambulatory Visit: Payer: Self-pay

## 2014-07-17 ENCOUNTER — Ambulatory Visit (HOSPITAL_BASED_OUTPATIENT_CLINIC_OR_DEPARTMENT_OTHER): Payer: Medicare Other

## 2014-07-17 DIAGNOSIS — E785 Hyperlipidemia, unspecified: Secondary | ICD-10-CM | POA: Diagnosis not present

## 2014-07-17 DIAGNOSIS — I4510 Unspecified right bundle-branch block: Secondary | ICD-10-CM | POA: Diagnosis not present

## 2014-07-17 DIAGNOSIS — R0609 Other forms of dyspnea: Secondary | ICD-10-CM

## 2014-07-17 DIAGNOSIS — R072 Precordial pain: Secondary | ICD-10-CM | POA: Diagnosis not present

## 2014-07-17 DIAGNOSIS — R9439 Abnormal result of other cardiovascular function study: Secondary | ICD-10-CM | POA: Insufficient documentation

## 2014-07-17 LAB — MYOCARDIAL PERFUSION IMAGING
Estimated workload: 7 METS
Exercise duration (min): 6 min
Exercise duration (sec): 0 s
LV dias vol: 71 mL
LV sys vol: 15 mL
MPHR: 150 {beats}/min
Nuc Stress EF: 79 %
Peak HR: 141 {beats}/min
Percent HR: 94 %
RATE: 0.27
Rest HR: 63 {beats}/min
SDS: 2
SRS: 1
SSS: 3
TID: 0.92

## 2014-07-17 MED ORDER — TECHNETIUM TC 99M SESTAMIBI GENERIC - CARDIOLITE
33.0000 | Freq: Once | INTRAVENOUS | Status: AC | PRN
  Administered 2014-07-17: 33 via INTRAVENOUS

## 2014-07-17 MED ORDER — TECHNETIUM TC 99M SESTAMIBI GENERIC - CARDIOLITE
11.0000 | Freq: Once | INTRAVENOUS | Status: AC | PRN
  Administered 2014-07-17: 11 via INTRAVENOUS

## 2014-07-26 ENCOUNTER — Other Ambulatory Visit (INDEPENDENT_AMBULATORY_CARE_PROVIDER_SITE_OTHER): Payer: Medicare Other | Admitting: *Deleted

## 2014-07-26 DIAGNOSIS — E785 Hyperlipidemia, unspecified: Secondary | ICD-10-CM

## 2014-07-26 LAB — COMPREHENSIVE METABOLIC PANEL
ALT: 24 U/L (ref 0–35)
AST: 25 U/L (ref 0–37)
Albumin: 4.5 g/dL (ref 3.5–5.2)
Alkaline Phosphatase: 101 U/L (ref 39–117)
BUN: 10 mg/dL (ref 6–23)
CO2: 29 mEq/L (ref 19–32)
Calcium: 9.8 mg/dL (ref 8.4–10.5)
Chloride: 105 mEq/L (ref 96–112)
Creatinine, Ser: 0.84 mg/dL (ref 0.40–1.20)
GFR: 71.03 mL/min (ref >60.00)
Glucose, Bld: 90 mg/dL (ref 70–99)
Potassium: 3.8 mEq/L (ref 3.5–5.1)
Sodium: 139 mEq/L (ref 135–145)
Total Bilirubin: 0.4 mg/dL (ref 0.2–1.2)
Total Protein: 7.1 g/dL (ref 6.0–8.3)

## 2014-07-26 NOTE — Addendum Note (Signed)
Addended by: Eulis Foster on: 07/26/2014 11:07 AM   Modules accepted: Orders

## 2014-07-31 ENCOUNTER — Other Ambulatory Visit: Payer: Medicare Other

## 2014-08-01 ENCOUNTER — Encounter: Payer: Self-pay | Admitting: Physician Assistant

## 2014-08-03 ENCOUNTER — Encounter: Payer: Self-pay | Admitting: Physician Assistant

## 2014-08-03 ENCOUNTER — Ambulatory Visit (INDEPENDENT_AMBULATORY_CARE_PROVIDER_SITE_OTHER): Payer: Medicare Other | Admitting: Physician Assistant

## 2014-08-03 VITALS — BP 120/80 | HR 80 | Temp 98.4°F | Resp 18 | Wt 161.0 lb

## 2014-08-03 DIAGNOSIS — N905 Atrophy of vulva: Secondary | ICD-10-CM | POA: Insufficient documentation

## 2014-08-03 DIAGNOSIS — R350 Frequency of micturition: Secondary | ICD-10-CM

## 2014-08-03 DIAGNOSIS — N760 Acute vaginitis: Secondary | ICD-10-CM | POA: Diagnosis not present

## 2014-08-03 LAB — URINALYSIS, ROUTINE W REFLEX MICROSCOPIC
BILIRUBIN URINE: NEGATIVE
Glucose, UA: NEGATIVE mg/dL
KETONES UR: NEGATIVE mg/dL
Leukocytes, UA: NEGATIVE
Nitrite: NEGATIVE
PH: 5.5 (ref 5.0–8.0)
PROTEIN: NEGATIVE mg/dL
Specific Gravity, Urine: 1.01 (ref 1.005–1.030)
Urobilinogen, UA: 0.2 mg/dL (ref 0.0–1.0)

## 2014-08-03 LAB — URINALYSIS, MICROSCOPIC ONLY
Casts: NONE SEEN
Crystals: NONE SEEN
WBC UA: NONE SEEN WBC/hpf (ref <3)

## 2014-08-03 LAB — WET PREP FOR TRICH, YEAST, CLUE
Clue Cells Wet Prep HPF POC: NONE SEEN
Trich, Wet Prep: NONE SEEN
Yeast Wet Prep HPF POC: NONE SEEN

## 2014-08-03 NOTE — Progress Notes (Signed)
    Patient ID: Melek Pownall MRN: [id number], DOB: 1945-12-28, 69 y.o. Date of Encounter: 08/03/2014, 9:06 AM    Chief Complaint:  Chief Complaint  Patient presents with  . Frequent urination    Discomfort and unusual odor     HPI: 69 y.o. year old white female says that symptoms started a week ago. Had a little bit of itching and burning sensation in her vaginal area. Some urinary frequency. Says " it felt like it may be something or maybe nothing." Says that she went ahead and made this appointment in case things got worse. However they have not gotten worse so she contemplated just canceling the appointment. Says that it still feels a little itchy and irritated ---says that it is mild and not severe. Has no dysuria. Frequency has not worsened. No urinary urgency. No fevers or chills. Has not noticed any vaginal discharge.     Home Meds:   Outpatient Prescriptions Prior to Visit  Medication Sig Dispense Refill  . simvastatin (ZOCOR) 40 MG tablet Take 1 tablet (40 mg total) by mouth at bedtime. 30 tablet 1   No facility-administered medications prior to visit.    Allergies:  Allergies  Allergen Reactions  . Latex     itching      Review of Systems: See HPI for pertinent ROS. All other ROS negative.    Physical Exam: Blood pressure 120/80, pulse 80, temperature 98.4 F (36.9 C), temperature source Oral, resp. rate 18, weight 161 lb (73.029 kg)., Body mass index is 26.79 kg/(m^2). General:  WNWD WF. Appears in no acute distress. Lungs: Clear bilaterally to auscultation without wheezes, rales, or rhonchi. Breathing is unlabored. Heart: Regular rhythm. No murmurs, rubs, or gallops. Msk:  Strength and tone normal for age. Pelvic Exam: External genitalia normal. Vaginal mucosa appears atrophic. Thin and inflamed. No vaginal discharge present at all. Bimanual exam normal. Extremities/Skin: Warm and dry.  Neuro: Alert and oriented X 3. Moves all extremities spontaneously.  Gait is normal. CNII-XII grossly in tact. Psych:  Responds to questions appropriately with a normal affect.     ASSESSMENT AND PLAN:  69 y.o. year old female with  1. Frequent urination - Urinalysis, Routine w reflex microscopic (not at [facility])  2. Vaginitis and vulvovaginitis - WET PREP FOR TRICH, YEAST, CLUE  3. Vulvar atrophy Reviewed urinalysis and wet prep results. Completely normal. No sign of UTI yeast infection or BV. Discussed with patient that symptoms are secondary to atrophic vaginitis. Explained cuase of this with patient. Discussed treatment using estrogen-containing cream. She defers treatment now. States that the symptoms are very minimal. Told her that if symptoms worsen to call us and we will prescribe estrogen cream.   Signed, [address] [place], [place], [facility] 08/03/2014 9:06 AM

## 2014-08-27 ENCOUNTER — Other Ambulatory Visit: Payer: Self-pay | Admitting: Cardiology

## 2014-09-08 ENCOUNTER — Ambulatory Visit (INDEPENDENT_AMBULATORY_CARE_PROVIDER_SITE_OTHER): Payer: Medicare Other | Admitting: Cardiology

## 2014-09-08 ENCOUNTER — Encounter: Payer: Self-pay | Admitting: Cardiology

## 2014-09-08 VITALS — BP 132/82 | HR 71 | Ht 65.0 in | Wt 161.0 lb

## 2014-09-08 DIAGNOSIS — R072 Precordial pain: Secondary | ICD-10-CM

## 2014-09-08 DIAGNOSIS — E785 Hyperlipidemia, unspecified: Secondary | ICD-10-CM | POA: Diagnosis not present

## 2014-09-08 NOTE — Patient Instructions (Signed)
Your physician recommends that you continue on your current medications as directed. Please refer to the Current Medication list given to you today.     Your physician recommends that you return for lab work in: NEXT Tuesday 09/12/14 AT OUR OFFICE TO CHECK A CMET AND LIPIDS--PLEASE COME FASTING TO THIS APPOINTMENT      Your physician wants you to follow-up in: [employer] will receive a reminder letter in the mail two months in advance. If you don't receive a letter, please call our office to schedule the follow-up appointment.

## 2014-09-08 NOTE — Progress Notes (Signed)
Patient ID: Beverly Carter, female   DOB: Jan 01, 1946, 69 y.o.   MRN: [id number]      Cardiology Office Note  Date:  09/08/2014   ID:  Beverly Carter, DOB 15-Feb-1945, MRN [id number]  PCP:  Karis Juba, PA-C  Cardiologist:  Dorothy Spark, MD   Chief complain: follow up for CP and HLP.   History of Present Illness: Beverly Carter is a 69 y.o. female who presents for evaluation of chest pain on exertion.   - walking about 1/2 mile developed retrosternal chest pain that improved with rest. She has had SOB her whole life - allergies, but she feels that it has been getting worse lately, tried to run few years ago with significant chest burning. Denies syncope, orthopnea, PND, claudications.  She has never smoked. Mother died of MI at age 33.  Her stress test was negative, LDL 164, TG 213. She was started on simvastatin 40 mg po daily. No side effects. No more chest pains.   Past Medical History  Diagnosis Date  . Allergy   . Eczema   . Lactose intolerance     per pt  . Cataracts, bilateral   . Diverticulosis of colon (without mention of hemorrhage) 2005   Past Surgical History  Procedure Laterality Date  . Vaginal hysterectomy    . Carpal tunnel release Right    Current Outpatient Prescriptions  Medication Sig Dispense Refill  . simvastatin (ZOCOR) 40 MG tablet TAKE 1 TABLET (40 MG TOTAL) BY MOUTH AT BEDTIME. 30 tablet 1   No current facility-administered medications for this visit.   Allergies:   Latex   Social History:  The patient  reports that she has never smoked. She has never used smokeless tobacco. She reports that she does not drink alcohol or use illicit drugs.   Family History:  The patient's family history includes Dementia in her father; Heart disease (age of onset: 83) in her mother; Hypertension in her mother. There is no history of Colon cancer.   ROS:  Please see the history of present illness.   Otherwise, review of systems are positive for none.    All other systems are reviewed and negative.   PHYSICAL EXAM: VS:  BP 132/82 mmHg  Pulse 71  Ht 5\' 5"  (1.651 m)  Wt 161 lb (73.029 kg)  BMI 26.79 kg/m2 , BMI Body mass index is 26.79 kg/(m^2). GEN: Well nourished, well developed, in no acute distress HEENT: normal Neck: no JVD, carotid bruits, or masses Cardiac: RRR; no murmurs, rubs, or gallops,no edema  Respiratory:  clear to auscultation bilaterally, normal work of breathing GI: soft, nontender, nondistended, + BS MS: no deformity or atrophy Skin: warm and dry, no rash Neuro:  Strength and sensation are intact Psych: euthymic mood, full affect  EKG:  EKG is ordered today. The ekg ordered today demonstrates SR, RBBB  Recent Labs: 12/06/2013: Hemoglobin 13.4; Platelets 207; TSH 1.647 07/26/2014: ALT 24; BUN 10; Creatinine, Ser 0.84; Potassium 3.8; Sodium 139   Lipid Panel    Component Value Date/Time   CHOL 262* 06/27/2014 1651   CHOL 250* 12/06/2013 0824   TRIG 213* 06/27/2014 1651   TRIG 85 12/06/2013 0824   HDL 55 06/27/2014 1651   HDL 55 12/06/2013 0824   CHOLHDL 4.5 12/06/2013 0824   VLDL 17 12/06/2013 0824   LDLCALC 164* 06/27/2014 1651   LDLCALC 178* 12/06/2013 0824    Wt Readings from Last 3 Encounters:  09/08/14 161 lb (73.029 kg)  08/03/14 161  lb (73.029 kg)  07/17/14 161 lb (73.029 kg)    Other studies Reviewed: Additional studies/ records that were reviewed today include: prior ECG Review of the above records demonstrates: ECG in 2012 SR, normal ECG  ECG: SR, RBBB  TTE: 07/17/2014 - Left ventricle: The cavity size was normal. Wall thickness was normal. Systolic function was normal. The estimated ejection fraction was in the range of 60% to 65%. Wall motion was normal; there were no regional wall motion abnormalities. Left ventricular diastolic function parameters were normal.    ASSESSMENT AND PLAN:  1.  Typical exertional dyspnea - new RBBB when compared to the prior ECG in 2012, FH of  CAD - we will schedule an exercise treadmill stress test. Normal echocardiogram and normal nuclear stress test.  2. Hyperlipidemia - LDL 178, TG 213, started on simvastatin 40 mg po daily, repeat lipids and CMP. Labs/ tests ordered today include:   Follow up in 1 year.  Signed, Dorothy Spark, MD  09/08/2014 3:08 PM    [facility] [place], [place], [place]  [address] Phone: [phone]; Fax: [phone]

## 2014-09-12 ENCOUNTER — Encounter: Payer: Self-pay | Admitting: Physician Assistant

## 2014-09-12 ENCOUNTER — Other Ambulatory Visit (INDEPENDENT_AMBULATORY_CARE_PROVIDER_SITE_OTHER): Payer: Medicare Other | Admitting: *Deleted

## 2014-09-12 DIAGNOSIS — R072 Precordial pain: Secondary | ICD-10-CM | POA: Diagnosis not present

## 2014-09-12 DIAGNOSIS — E785 Hyperlipidemia, unspecified: Secondary | ICD-10-CM | POA: Diagnosis not present

## 2014-09-12 LAB — LIPID PANEL
Cholesterol: 156 mg/dL (ref 0–200)
HDL: 55.4 mg/dL (ref >39.00)
LDL Cholesterol: 85 mg/dL (ref 0–99)
NonHDL: 100.72
Total CHOL/HDL Ratio: 3
Triglycerides: 80 mg/dL (ref 0.0–149.0)
VLDL: 16 mg/dL (ref 0.0–40.0)

## 2014-09-12 LAB — COMPREHENSIVE METABOLIC PANEL
ALT: 21 U/L (ref 0–35)
AST: 23 U/L (ref 0–37)
Albumin: 4.2 g/dL (ref 3.5–5.2)
Alkaline Phosphatase: 96 U/L (ref 39–117)
BUN: 14 mg/dL (ref 6–23)
CO2: 30 mEq/L (ref 19–32)
Calcium: 9.3 mg/dL (ref 8.4–10.5)
Chloride: 106 mEq/L (ref 96–112)
Creatinine, Ser: 0.84 mg/dL (ref 0.40–1.20)
GFR: 71 mL/min (ref >60.00)
Glucose, Bld: 97 mg/dL (ref 70–99)
Potassium: 3.9 mEq/L (ref 3.5–5.1)
Sodium: 142 mEq/L (ref 135–145)
Total Bilirubin: 0.4 mg/dL (ref 0.2–1.2)
Total Protein: 6.4 g/dL (ref 6.0–8.3)

## 2014-09-14 ENCOUNTER — Encounter: Payer: Self-pay | Admitting: Physician Assistant

## 2014-09-14 ENCOUNTER — Encounter: Payer: Medicare Other | Admitting: Physician Assistant

## 2014-09-14 ENCOUNTER — Ambulatory Visit: Payer: Medicare Other

## 2014-09-18 NOTE — Progress Notes (Signed)
This encounter was created in error - please disregard.

## 2014-09-30 DIAGNOSIS — Z23 Encounter for immunization: Secondary | ICD-10-CM | POA: Diagnosis not present

## 2014-10-22 ENCOUNTER — Other Ambulatory Visit: Payer: Self-pay | Admitting: Cardiology

## 2014-10-30 ENCOUNTER — Encounter: Payer: Self-pay | Admitting: Physician Assistant

## 2014-11-27 ENCOUNTER — Encounter: Payer: Self-pay | Admitting: Physician Assistant

## 2014-12-21 ENCOUNTER — Ambulatory Visit (INDEPENDENT_AMBULATORY_CARE_PROVIDER_SITE_OTHER): Payer: Medicare Other | Admitting: Physician Assistant

## 2014-12-21 ENCOUNTER — Encounter: Payer: Self-pay | Admitting: Physician Assistant

## 2014-12-21 VITALS — BP 124/76 | HR 64 | Temp 98.2°F | Resp 18 | Ht 65.0 in | Wt 158.0 lb

## 2014-12-21 DIAGNOSIS — M25512 Pain in left shoulder: Secondary | ICD-10-CM | POA: Diagnosis not present

## 2014-12-21 DIAGNOSIS — Z0000 Encounter for general adult medical examination without abnormal findings: Secondary | ICD-10-CM

## 2014-12-21 DIAGNOSIS — L609 Nail disorder, unspecified: Secondary | ICD-10-CM | POA: Diagnosis not present

## 2014-12-21 DIAGNOSIS — L608 Other nail disorders: Secondary | ICD-10-CM

## 2014-12-21 NOTE — Progress Notes (Signed)
Patient ID: Beverly Carter MRN: [id number], DOB: 1945-05-24, 69 y.o. Date of Encounter: @DATE @  Chief Complaint:  Chief Complaint  Patient presents with  . Annual Exam    not fasting    HPI: 69 y.o. year old female  presents for CPE.   She defers pelvic exam-has had complete hysterectomy. Agreeable to breast exam.  She states that her left shoulder has been bothering her since June. Whenever she moves that she hears popping and grinding. Says that there is a little bit of discomfort there all the time but when she tries to abduct her arm she has increased discomfort. Says that even to dry her hair she has to lift that arm and lower it very slowly.  Also says that she sees some discolored areas on her third toes bilaterally and wants to see a podiatrist about this.  No other complaints or concerns.   Past Medical History  Diagnosis Date  . Allergy   . Eczema   . Lactose intolerance     per pt  . Cataracts, bilateral   . Diverticulosis of colon (without mention of hemorrhage) 2005     Home Meds: Multivitamin   Allergies:  Allergies  Allergen Reactions  . Latex     itching  . Other     Cigarette smoke- allergies     Social History   Social History  . Marital Status: Single    Spouse Name: N/A  . Number of Children: 5  . Years of Education: N/A   Occupational History  . student    Social History Main Topics  . Smoking status: Never Smoker   . Smokeless tobacco: Never Used  . Alcohol Use: No  . Drug Use: No  . Sexual Activity: Not on file   Other Topics Concern  . Not on file   Social History Narrative    Family History  Problem Relation Age of Onset  . Heart disease Mother 4    MI  . Colon cancer Neg Hx   . Hypertension Mother   . Dementia Father      Review of Systems:  See HPI for pertinent ROS. All other ROS negative.    Physical Exam: Blood pressure 124/76, pulse 64, temperature 98.2 F (36.8 C), temperature source Oral, resp.  rate 18, height 5\' 5"  (1.651 m), weight 158 lb (71.668 kg)., Body mass index is 26.29 kg/(m^2). General:WNWD WF Appears in no acute distress. Head: Normocephalic, atraumatic, eyes without discharge, sclera non-icteric, nares are without discharge. Bilateral auditory canals clear, TM's are without perforation, pearly grey and translucent with reflective cone of light bilaterally. Oral cavity moist, posterior pharynx without exudate, erythema, peritonsillar abscess, or post nasal drip.  Neck: Supple. No thyromegaly. No lymphadenopathy. Lungs: Clear bilaterally to auscultation without wheezes, rales, or rhonchi. Breathing is unlabored. Heart: RRR with S1 S2. No murmurs, rubs, or gallops. Breasts: normal. No masses. No nipple discharge. No skin changes. Abdomen: Soft, non-tender, non-distended with normoactive bowel sounds. No hepatomegaly. No rebound/guarding. No obvious abdominal masses. Musculoskeletal:  Strength and tone normal for age.  Left shoulder: There are no areas of tenderness with palpation When she does forward extension she is able to perform this motion but it does cause some discomfort in the anterior aspect of the joint. When she performs abduction again she is able to fully abduct but it does cause some discomfort in the anterior aspect of the joint Forearm Strength with adduction and abduction 5/5 Extremities/Skin: Warm and dry. No clubbing  or cyanosis. No edema. No rashes or suspicious lesions. 3rd toes bilaterally: Both of these toenails have area of dark brown pigment. Reminder of those toenails and all other toenails normal. Neuro: Alert and oriented X 3. Moves all extremities spontaneously. Gait is normal. CNII-XII grossly in tact. Psych:  Responds to questions appropriately with a normal affect.     ASSESSMENT AND PLAN:  69 y.o. year old female with  1. Visit for preventive health examination  A.  Screening Labs:   She had fasting labs 12/06/13 . CBC normal CMET  normal TSH normal Vitamin D normal LDL was 178---see # 6 below.  B.  Mammogram: Last--- 05/17/2014----nml-----patient states that she does these every 2 years and will follow-up with this in 05/2016 C. Screening Colonoscopy: Had at age 51 and at age 2. -Nml. Had another colonoscopy 06/21/2013--- diverticulosis-- otherwise normal--- repeat 10 years D. Immunizations:   Tetanus: Had with [facility] 2010  Zostavax: Had here 10/11/2010   Influenza: She received this 09/28/13  Pneumonia vaccine: She defers/refuses 2. Postmenopausal HRT (hormone replacement therapy) She has stopped HRT. Has no/minimal hotflashes.  Last DEXA was in [place] in 2009-nml per pt. At [facility] 06/2012 ordered f/y DEXA--  H/O complete hysterectomy/oophorectomy-now off HRT - DG Bone Density--08/12/2012--Excellent--Normal.   3. Allergic rhinitis On Claritan, Flonase     Hyperlipidemia The time of her physical 06/2012 she was not fasting but she returned the following day fasting. These labs revealed elevated cholesterol with an LDL of 193. I told her to start simvastatin 20 mg and come in for follow-up lab work. Follow-up lab work 08/13/2012 LDL was down to 102. She subsequently had a follow-up visit with me regarding those onto/4/15. At that visit she reported that sometime around October or November she stopped the simvastatin. Stated that was causing her to have a crawling sensation on her legs and was causing significant myalgias and muscle cramps. She stopped the simvastatin and all the symptoms resolved. At that visit we discussed her cardiac risk factors and discussed whether or not to start pravastatin. She reported that her mother had an MI at age 40. That was her first diagnosed CAD. She said that her mother was a smoker was obese and was sedentary. She said that her father lived until age 67 and had no CAD. She wanted to stay off of medication and monitor. We had planned that if LDL increased and we would use  pravastatin.  12/2013-- I discussed her current lipid panel with LDL 178. --------------Discussed using pravastatin but she refuses.  At visit 12/2014 she reports that she has had follow-up with cardiology and that they did have her on pravastatin but then she developed significant myalgias which resolved after she stopped pravastatin. She will remain off lipid medication   Unspecified vitamin D deficiency - Vitamin D level normal.   Bone Density--normal   Medicare annual wellness visit, subsequent See below   Left shoulder pain Gave and reviewed a handout with stretches for her to do. Dust that symptoms and exam findings consistent with tendinitis/impingement syndrome. She was not happy with doing stretches and exercises and wants referral to orthopedics. - Ambulatory referral to Orthopedic Surgery  Toenail deformity She wants to follow-up with a podiatrist. Order placed for referral. - Ambulatory referral to Podiatry   Subjective:   Patient presents for Medicare Annual/Subsequent preventive examination.   Review Past Medical/Family/Social: These are all reviewed and documented today   Risk Factors  Current exercise habits: She does some walking  for exercise Dietary issues discussed: She follows a low cholesterol diet we have discussed this extensively in the past when she was having intolerances to statins  Cardiac risk factors: She recently saw a cardiologist and had cardiology evaluation.  Depression Screen  (Note: if answer to either of the following is "Yes", a more complete depression screening is indicated)  Over the past two weeks, have you felt down, depressed or hopeless? No Over the past two weeks, have you felt little interest or pleasure in doing things? No Have you lost interest or pleasure in daily life? No Do you often feel hopeless? No Do you cry easily over simple problems? No   Activities of Daily Living  In your present state of health, do you have  any difficulty performing the following activities?:  Driving? No  Managing money? No  Feeding yourself? No  Getting from bed to chair? No  Climbing a flight of stairs? No  Preparing food and eating?: No  Bathing or showering? No  Getting dressed: No  Getting to the toilet? No  Using the toilet:No  Moving around from place to place: No  In the past year have you fallen or had a near fall?:No  Are you sexually active? No  Do you have more than one partner? No   Hearing Difficulties: No  Do you often ask people to speak up or repeat themselves? No  Do you experience ringing or noises in your ears? No Do you have difficulty understanding soft or whispered voices? No  Do you feel that you have a problem with memory? No Do you often misplace items? No  Do you feel safe at home? Yes  Cognitive Testing  Alert? Yes Normal Appearance?Yes  Oriented to person? Yes Place? Yes  Time? Yes  Recall of three objects? Yes  Can perform simple calculations? Yes  Displays appropriate judgment?Yes  Can read the correct time from a watch face?Yes   List the Names of Other Physician/Practitioners you currently use:  None  Indicate any recent Medical Services you may have received from other than [facility] providers in the past year (date may be approximate).  None Screening Tests / Date------------ this information is all documented above Colonoscopy                     Zostavax  Mammogram  Influenza Vaccine  Tetanus/tdap    Assessment:    Annual wellness medicare exam   Plan:    During the course of the visit the patient was educated and counseled about appropriate screening and preventive services including:  Screening mammography  Colorectal cancer screening  Shingles vaccine. Prescription given to that she can get the vaccine at the pharmacy or Medicare part D.  Screen + for depression. PHQ- 9 score of 12 (moderate depression). We discussed the options of counseling versus possibly a  medication. I encouraged her strongly think about the counseling. She is going through some medical problems currently and her husband is as well Mrs. been very stressful for her. She says she will think about it. She does have Xanax to use as needed. Though she may benefit from an SSRI for her more depressive type symptoms but she wants to hold off at this time.  I aksed her to please have her cardioloist send records since we have none on file.  Diet review for nutrition referral? Yes ____ Not Indicated __x__  Patient Instructions (the written plan) was given to the patient.  Medicare  Attestation  I have personally reviewed:  The patient's medical and social history  Their use of alcohol, tobacco or illicit drugs  Their current medications and supplements  The patient's functional ability including ADLs,fall risks, home safety risks, cognitive, and hearing and visual impairment  Diet and physical activities  Evidence for depression or mood disorders  The patient's weight, height, BMI, and visual acuity have been recorded in the chart. I have made referrals, counseling, and provided education to the patient based on review of the above and I have provided the patient with a written personalized care plan for preventive services.       Follow-up visit one year or sooner if needed  SignedOlean Ree [place], [place], [facility] 12/21/2014 2:15 PM

## 2015-01-12 DIAGNOSIS — M25512 Pain in left shoulder: Secondary | ICD-10-CM | POA: Diagnosis not present

## 2015-01-12 DIAGNOSIS — M7542 Impingement syndrome of left shoulder: Secondary | ICD-10-CM | POA: Diagnosis not present

## 2015-01-19 DIAGNOSIS — M7542 Impingement syndrome of left shoulder: Secondary | ICD-10-CM | POA: Diagnosis not present

## 2015-01-23 DIAGNOSIS — M7542 Impingement syndrome of left shoulder: Secondary | ICD-10-CM | POA: Diagnosis not present

## 2015-01-23 DIAGNOSIS — M25512 Pain in left shoulder: Secondary | ICD-10-CM | POA: Diagnosis not present

## 2015-05-02 ENCOUNTER — Encounter: Payer: Self-pay | Admitting: Physician Assistant

## 2015-05-03 ENCOUNTER — Emergency Department (INDEPENDENT_AMBULATORY_CARE_PROVIDER_SITE_OTHER)
Admission: EM | Admit: 2015-05-03 | Discharge: 2015-05-03 | Disposition: A | Discharge status: Home / Self Care | Payer: Medicare Other | Source: Home / Self Care | Attending: Family Medicine | Admitting: Family Medicine

## 2015-05-03 ENCOUNTER — Encounter (HOSPITAL_COMMUNITY): Payer: Self-pay

## 2015-05-03 DIAGNOSIS — R5381 Other malaise: Secondary | ICD-10-CM | POA: Diagnosis not present

## 2015-05-03 DIAGNOSIS — R52 Pain, unspecified: Secondary | ICD-10-CM | POA: Diagnosis not present

## 2015-05-03 LAB — POCT I-STAT, CHEM 8
BUN: 24 mg/dL — AB (ref 6–20)
CALCIUM ION: 1.15 mmol/L (ref 1.13–1.30)
CHLORIDE: 94 mmol/L — AB (ref 101–111)
Creatinine, Ser: 0.8 mg/dL (ref 0.44–1.00)
GLUCOSE: 119 mg/dL — AB (ref 65–99)
HCT: 42 % (ref 36.0–46.0)
Hemoglobin: 14.3 g/dL (ref 12.0–15.0)
Potassium: 3.8 mmol/L (ref 3.5–5.1)
Sodium: 132 mmol/L — ABNORMAL LOW (ref 135–145)
TCO2: 26 mmol/L (ref 0–100)

## 2015-05-03 MED ORDER — SODIUM CHLORIDE 0.9 % IV BOLUS (SEPSIS)
1000.0000 mL | Freq: Once | INTRAVENOUS | Status: AC
  Administered 2015-05-03: 1000 mL via INTRAVENOUS

## 2015-05-03 NOTE — ED Notes (Signed)
Patient states she may have had a possible heat stroke or flu, her symptoms are fever, chills, headache, and muscle ache x3 days Patient has taken Oxycodone for pain  No acute distress

## 2015-05-03 NOTE — Discharge Instructions (Signed)
It is a pleasure to see you today.  I believe your muscle aches and headache may be related to the physical exertion you described.    Your labwork done in the urgent care today shows normal kidney function.   I recommend continued oral rehydration; acetaminophen as needed for aches and pains.

## 2015-05-03 NOTE — ED Provider Notes (Addendum)
CSN: [id number]     Arrival date & time 05/03/15  1533 History   First MD Initiated Contact with Patient 05/03/15 1730     Chief Complaint  Patient presents with  . Headache  . Nausea   (Consider location/radiation/quality/duration/timing/severity/associated sxs/prior Treatment) Patient is a 70 y.o. female presenting with headaches. The history is provided by the patient. No language interpreter was used.  Headache Associated symptoms: fatigue and fever   Associated symptoms: no congestion, no cough, no drainage and no sinus pressure   Patient presents with complaint of pain throughout her body, headache, and fever to 103F yesterday.  She was working in the yard of her new home on Tuesday March 28th clearing brush and hauling to dump; that evening began with severe body aches throughout her entire body.  Also developed headache across crown of her head and occiput. Has had slipped cervical discs with neck/headache pain, this is markedly different from her usual pain.  Lay in bed all day Tuesday (yesterday) with headache and nausea, has been taking oxycodone/acetaminophen and ondansetron for the headache and nausea. Today is slightly better, still would rather be in a dark room resting. Poor oral intake of food, has been drinking water.  Says the body aches are mildly better today, still bad.    Past Medical History  Diagnosis Date  . Allergy   . Eczema   . Lactose intolerance     per pt  . Cataracts, bilateral   . Diverticulosis of colon (without mention of hemorrhage) 2005   Past Surgical History  Procedure Laterality Date  . Vaginal hysterectomy    . Carpal tunnel release Right    Family History  Problem Relation Age of Onset  . Heart disease Mother 53    MI  . Colon cancer Neg Hx   . Hypertension Mother   . Dementia Father    Social History  Substance Use Topics  . Smoking status: Never Smoker   . Smokeless tobacco: Never Used  . Alcohol Use: No   OB History    No data  available     Review of Systems  Constitutional: Positive for fever, chills, activity change, appetite change and fatigue.  HENT: Negative for congestion, postnasal drip, rhinorrhea, sinus pressure and sneezing.   Respiratory: Negative for cough and shortness of breath.   Cardiovascular: Positive for chest pain.       One brief episode of central sharp chest pain this morning while laying in bed, resolved in moments. No associated radiation/diaphoresis. No recurrence  Neurological: Positive for headaches.    Allergies  Latex and Other  Home Medications   Prior to Admission medications   Medication Sig Start Date End Date Taking? Authorizing Provider  Multiple Vitamin (MULTIVITAMIN) tablet Take 1 tablet by mouth daily.   Yes Historical Provider, MD  ondansetron (ZOFRAN) 4 MG tablet Take 4 mg by mouth every 8 (eight) hours as needed for nausea or vomiting.   Yes Historical Provider, MD  oxyCODONE-acetaminophen (PERCOCET/ROXICET) 5-325 MG tablet Take 1 tablet by mouth daily as needed for severe pain.   Yes Historical Provider, MD   Meds Ordered and Administered this Visit   Medications  sodium chloride 0.9 % bolus 1,000 mL (not administered)    BP 145/85 mmHg  Pulse 79  Temp(Src) 98.6 F (37 C) (Oral)  Resp 16  SpO2 95% No data found.   Physical Exam  Constitutional: She appears well-developed and well-nourished. No distress.  HENT:  Head: Normocephalic.  Mouth/Throat:  No oropharyngeal exudate.  Clear oropharynx, mildly dry mucus membranes  No frontal or maxillary sinus tenderness  Eyes: Conjunctivae and EOM are normal. Pupils are equal, round, and reactive to light. Right eye exhibits no discharge. Left eye exhibits no discharge. No scleral icterus.  Neck: Normal range of motion. Neck supple.  Full active ROM neck in flexion/extension, rotation L and R.   Cardiovascular: Normal rate, regular rhythm and normal heart sounds.   No murmur heard. Pulmonary/Chest: Effort  normal and breath sounds normal. No respiratory distress. She has no wheezes. She has no rales. She exhibits no tenderness.  Abdominal: Soft. Bowel sounds are normal. She exhibits no distension and no mass. There is no tenderness. There is no rebound and no guarding.  Musculoskeletal:  Sensation in feet and hands intact and symmetric bilaterally.  Handgrip full and symmetric.   No marked tenderness to palpate over shoulders, arms, legs, calves.  Full active ROM ankles, knees, shoulders/elbows/wrists.   Lymphadenopathy:    She has no cervical adenopathy.  Skin: She is not diaphoretic.    ED Course  Procedures (including critical care time)  Labs Review Labs Reviewed - No data to display  Imaging Review No results found.   Visual Acuity Review  Right Eye Distance:   Left Eye Distance:   Bilateral Distance:    Right Eye Near:   Left Eye Near:    Bilateral Near:         MDM  No diagnosis found. Patient with acute onset myalgias, headache and episode of fever. Better today than before.  Consider rhabdomyolysis resulting from over-exertion. istat for check of renal function, lytes. Renal function normal; mild hyponatremia noted. Patient reports very light urine (not dark urine), so no UA ordered.   Home oral rehydration, acetaminophen. Avoid NSAIDs.  Follow up with primary physician if worsening or not improving.     Willeen Niece, MD 05/03/15 [place], MD 05/03/15 [phone]

## 2015-05-22 DIAGNOSIS — M7541 Impingement syndrome of right shoulder: Secondary | ICD-10-CM | POA: Diagnosis not present

## 2015-05-22 DIAGNOSIS — M7542 Impingement syndrome of left shoulder: Secondary | ICD-10-CM | POA: Diagnosis not present

## 2015-07-10 ENCOUNTER — Other Ambulatory Visit: Payer: Self-pay | Admitting: Physician Assistant

## 2015-09-07 DIAGNOSIS — H524 Presbyopia: Secondary | ICD-10-CM | POA: Diagnosis not present

## 2015-09-07 DIAGNOSIS — H5203 Hypermetropia, bilateral: Secondary | ICD-10-CM | POA: Diagnosis not present

## 2015-09-07 DIAGNOSIS — H52223 Regular astigmatism, bilateral: Secondary | ICD-10-CM | POA: Diagnosis not present

## 2015-09-07 DIAGNOSIS — H25813 Combined forms of age-related cataract, bilateral: Secondary | ICD-10-CM | POA: Diagnosis not present

## 2015-09-07 DIAGNOSIS — H43813 Vitreous degeneration, bilateral: Secondary | ICD-10-CM | POA: Diagnosis not present

## 2015-10-23 ENCOUNTER — Telehealth: Payer: Self-pay | Admitting: Cardiology

## 2015-10-23 NOTE — Telephone Encounter (Signed)
New Message  Pt voiced she may have to have her cholesterol checked/labs.  Please f/u with pt

## 2015-10-23 NOTE — Telephone Encounter (Signed)
Patient wanting to know if she needs lab work at her appointment this week. Informed patient that she will most likely need fasting lipid, so to be NPO after midnight for her appointment. Informed patient that this message would be sent to Dr. Meda Coffee to see if she wants  lab work collected before her appointment.

## 2015-10-24 NOTE — Telephone Encounter (Signed)
Advised the pt to come fasting to her appt tomorrow with Dr Meda Coffee, incase she orders lipids.  Informed the pt that she will see Dr Meda Coffee first, and if she advises that she needs labs at that visit, then we will order accordingly and take her to the lab.  Pt verbalized understanding, agrees with this plan, and gracious for all the assistance provided.

## 2015-10-25 ENCOUNTER — Ambulatory Visit (INDEPENDENT_AMBULATORY_CARE_PROVIDER_SITE_OTHER): Payer: Medicare Other | Admitting: Cardiology

## 2015-10-25 VITALS — BP 130/80 | HR 72 | Ht 65.0 in | Wt 155.0 lb

## 2015-10-25 DIAGNOSIS — E785 Hyperlipidemia, unspecified: Secondary | ICD-10-CM | POA: Diagnosis not present

## 2015-10-25 DIAGNOSIS — R072 Precordial pain: Secondary | ICD-10-CM

## 2015-10-25 DIAGNOSIS — R0600 Dyspnea, unspecified: Secondary | ICD-10-CM

## 2015-10-25 DIAGNOSIS — R0609 Other forms of dyspnea: Secondary | ICD-10-CM | POA: Diagnosis not present

## 2015-10-25 MED ORDER — RED YEAST RICE 600 MG PO CAPS
600.0000 mg | ORAL_CAPSULE | Freq: Every day | ORAL | Status: DC
Start: 2015-10-25 — End: 2022-11-19

## 2015-10-25 NOTE — Progress Notes (Signed)
Patient ID: Beverly Carter, female   DOB: Mar 25, 1945, 70 y.o.   MRN: [id number]      Cardiology Office Note  Date:  10/25/2015   ID:  Beverly Carter, DOB [birth year], MRN [id number]  PCP:  Karis Juba, PA-C  Cardiologist:  Ena Dawley, MD   Chief complain: follow up for CP and HLP.   History of Present Illness: Beverly Carter is a 70 y.o. female who presents for evaluation of chest pain on exertion.   - walking about 1/2 mile developed retrosternal chest pain that improved with rest. She has had SOB her whole life - allergies, but she feels that it has been getting worse lately, tried to run few years ago with significant chest burning. Denies syncope, orthopnea, PND, claudications.  She has never smoked. Mother died of MI at age 67.  Her stress test was negative, LDL 164, TG 213. She was started on simvastatin 40 mg po daily. No side effects. No more chest pains.  10/25/15 - 1 year follow up, she feels well, she has neck discopathy and upper extremity tingling. Remains active, no chest pain, DOE, no palpitations or syncope. She stopped using simvastatin as she doesn't like to take medicines.    Past Medical History:  Diagnosis Date  . Allergy   . Cataracts, bilateral   . Diverticulosis of colon (without mention of hemorrhage) 2005  . Eczema   . Lactose intolerance    per pt   Past Surgical History:  Procedure Laterality Date  . CARPAL TUNNEL RELEASE Right   . VAGINAL HYSTERECTOMY     Current Outpatient Prescriptions  Medication Sig Dispense Refill  . ibuprofen (ADVIL,MOTRIN) 100 MG tablet Take 100 mg by mouth daily as needed (headaches).    . Multiple Vitamin (MULTIVITAMIN) tablet Take 1 tablet by mouth daily.     No current facility-administered medications for this visit.    Allergies:   Latex and Other   Social History:  The patient  reports that she has never smoked. She has never used smokeless tobacco. She reports that she does not drink alcohol or use  drugs.   Family History:  The patient's family history includes Dementia in her father; Heart disease (age of onset: 3) in her mother; Hypertension in her mother.   ROS:  Please see the history of present illness.   Otherwise, review of systems are positive for none.   All other systems are reviewed and negative.   PHYSICAL EXAM: VS:  BP 130/80   Pulse 72   Ht 5\' 5"  (1.651 m)   Wt 155 lb (70.3 kg)   BMI 25.79 kg/m  , BMI Body mass index is 25.79 kg/m. GEN: Well nourished, well developed, in no acute distress  HEENT: normal  Neck: no JVD, carotid bruits, or masses Cardiac: RRR; no murmurs, rubs, or gallops,no edema  Respiratory:  clear to auscultation bilaterally, normal work of breathing GI: soft, nontender, nondistended, + BS MS: no deformity or atrophy  Skin: warm and dry, no rash Neuro:  Strength and sensation are intact Psych: euthymic mood, full affect  EKG:  EKG is ordered today. The ekg ordered today demonstrates SR, RBBB  Recent Labs: 05/03/2015: BUN 24; Creatinine, Ser 0.80; Hemoglobin 14.3; Potassium 3.8; Sodium 132   Lipid Panel    Component Value Date/Time   CHOL 156 09/12/2014 0918   CHOL 262 (H) 06/27/2014 1651   TRIG 80.0 09/12/2014 0918   TRIG 213 (H) 06/27/2014 1651   HDL 55.40 09/12/2014 [id number]  HDL 55 06/27/2014 1651   CHOLHDL 3 09/12/2014 0918   VLDL 16.0 09/12/2014 0918   LDLCALC 85 09/12/2014 0918   LDLCALC 164 (H) 06/27/2014 1651    Wt Readings from Last 3 Encounters:  10/25/15 155 lb (70.3 kg)  12/21/14 158 lb (71.7 kg)  09/14/14 161 lb (73 kg)    Other studies Reviewed: Additional studies/ records that were reviewed today include: prior ECG Review of the above records demonstrates: ECG in 2012 SR, normal ECG  ECG: SR, RBBB  TTE: 07/17/2014 - Left ventricle: The cavity size was normal. Wall thickness was normal. Systolic function was normal. The estimated ejection fraction was in the range of 60% to 65%. Wall motion was  normal; there were no regional wall motion abnormalities. Left ventricular diastolic function parameters were normal.    ASSESSMENT AND PLAN:  1.  DOE - stable, unchanged ECG, normal exercise nuclear stress test in 11/2014.  Normal echocardiogram.  2. Hyperlipidemia - LDL 178, TG 213, we started on simvastatin 40 mg po daily a year ago with improvement of LDL to 85, TG to 80, however she discontinued for no good reason. She is advised to take red yeast rice 600 mg po daily that she is willing to take.   Follow up in 1 year.  Signed, Ena Dawley, MD  10/25/2015 8:50 AM    [facility] [address], [place], [place]  [address] Phone: [phone]; Fax: [phone]

## 2015-10-25 NOTE — Patient Instructions (Signed)
Medication Instructions:   START TAKING OTC RED YEAST RICE 600 MG ONCE DAILY    Follow-Up:  Your physician wants you to follow-up in: [place] will receive a reminder letter in the mail two months in advance. If you don't receive a letter, please call our office to schedule the follow-up appointment.      If you need a refill on your cardiac medications before your next appointment, please call your pharmacy.

## 2015-10-26 ENCOUNTER — Encounter: Payer: Self-pay | Admitting: Cardiology

## 2016-01-02 DIAGNOSIS — Z0000 Encounter for general adult medical examination without abnormal findings: Secondary | ICD-10-CM | POA: Diagnosis not present

## 2016-02-19 DIAGNOSIS — R0609 Other forms of dyspnea: Secondary | ICD-10-CM | POA: Diagnosis not present

## 2016-02-19 DIAGNOSIS — G5601 Carpal tunnel syndrome, right upper limb: Secondary | ICD-10-CM | POA: Diagnosis not present

## 2016-02-19 DIAGNOSIS — M19012 Primary osteoarthritis, left shoulder: Secondary | ICD-10-CM | POA: Diagnosis not present

## 2016-02-19 DIAGNOSIS — M1712 Unilateral primary osteoarthritis, left knee: Secondary | ICD-10-CM | POA: Diagnosis not present

## 2016-02-19 DIAGNOSIS — F341 Dysthymic disorder: Secondary | ICD-10-CM | POA: Diagnosis not present

## 2016-02-28 ENCOUNTER — Other Ambulatory Visit: Payer: Self-pay | Admitting: Family Medicine

## 2016-02-28 DIAGNOSIS — Z1231 Encounter for screening mammogram for malignant neoplasm of breast: Secondary | ICD-10-CM

## 2016-03-13 ENCOUNTER — Ambulatory Visit
Admission: RE | Admit: 2016-03-13 | Discharge: 2016-03-13 | Disposition: A | Discharge status: Home / Self Care | Payer: Medicare Other | Source: Ambulatory Visit | Attending: Family Medicine | Admitting: Family Medicine

## 2016-03-13 DIAGNOSIS — Z1231 Encounter for screening mammogram for malignant neoplasm of breast: Secondary | ICD-10-CM | POA: Diagnosis not present

## 2016-03-20 DIAGNOSIS — M180 Bilateral primary osteoarthritis of first carpometacarpal joints: Secondary | ICD-10-CM | POA: Insufficient documentation

## 2016-03-20 DIAGNOSIS — G5603 Carpal tunnel syndrome, bilateral upper limbs: Secondary | ICD-10-CM | POA: Insufficient documentation

## 2016-03-20 DIAGNOSIS — M79642 Pain in left hand: Secondary | ICD-10-CM | POA: Diagnosis not present

## 2016-08-27 DIAGNOSIS — N760 Acute vaginitis: Secondary | ICD-10-CM | POA: Diagnosis not present

## 2016-08-27 DIAGNOSIS — J029 Acute pharyngitis, unspecified: Secondary | ICD-10-CM | POA: Diagnosis not present

## 2016-09-03 DIAGNOSIS — H2513 Age-related nuclear cataract, bilateral: Secondary | ICD-10-CM | POA: Diagnosis not present

## 2016-09-03 DIAGNOSIS — H524 Presbyopia: Secondary | ICD-10-CM | POA: Diagnosis not present

## 2016-09-03 DIAGNOSIS — H52223 Regular astigmatism, bilateral: Secondary | ICD-10-CM | POA: Diagnosis not present

## 2016-09-03 DIAGNOSIS — H5203 Hypermetropia, bilateral: Secondary | ICD-10-CM | POA: Diagnosis not present

## 2016-09-03 DIAGNOSIS — H43813 Vitreous degeneration, bilateral: Secondary | ICD-10-CM | POA: Diagnosis not present

## 2017-02-04 ENCOUNTER — Encounter: Payer: Self-pay | Admitting: Cardiology

## 2017-02-04 ENCOUNTER — Ambulatory Visit
Admission: RE | Admit: 2017-02-04 | Discharge: 2017-02-04 | Disposition: A | Discharge status: Home / Self Care | Payer: Medicare Other | Source: Ambulatory Visit | Attending: Cardiology | Admitting: Cardiology

## 2017-02-04 ENCOUNTER — Ambulatory Visit (INDEPENDENT_AMBULATORY_CARE_PROVIDER_SITE_OTHER): Payer: Medicare Other | Admitting: Cardiology

## 2017-02-04 VITALS — BP 130/80 | HR 65 | Ht 66.0 in | Wt 154.2 lb

## 2017-02-04 DIAGNOSIS — R0609 Other forms of dyspnea: Secondary | ICD-10-CM

## 2017-02-04 DIAGNOSIS — E7849 Other hyperlipidemia: Secondary | ICD-10-CM

## 2017-02-04 NOTE — Patient Instructions (Addendum)
Medication Instructions:   Your physician recommends that you continue on your current medications as directed. Please refer to the Current Medication list given to you today.   Labwork:  ASAP--TOMORROW IF POSSIBLE--TO CHECK YOUR CMET, CBC W DIFF, AND LIPIDS---PLEASE COME FASTING TO THIS LAB APPOINTMENT.    Testing/Procedures:  A chest x-ray takes a picture of the organs and structures inside the chest, including the heart, lungs, and blood vessels. This test can show several things, including, whether the heart is enlarges; whether fluid is building up in the lungs; and whether pacemaker / defibrillator leads are still in place.   Your physician has recommended that you have a pulmonary function test. Pulmonary Function Tests are a group of tests that measure how well air moves in and out of your lungs.    Follow-Up:  Your physician wants you to follow-up in: [place] will receive a reminder letter in the mail two months in advance. If you don't receive a letter, please call our office to schedule the follow-up appointment.        If you need a refill on your cardiac medications before your next appointment, please call your pharmacy.

## 2017-02-04 NOTE — Progress Notes (Signed)
Patient ID: Beverly Carter, female   DOB: 04/07/1945, 72 y.o.   MRN: [id number]      Cardiology Office Note  Date:  02/04/2017   ID:  Beverly Carter, DOB 01-10-1946, MRN [id number]  PCP:  Orlena Sheldon, PA-C  Cardiologist:  Ena Dawley, MD   Chief complain: follow up for CP and HLP.   History of Present Illness: Beverly Carter is a 72 y.o. female who presents for evaluation of chest pain on exertion.   - walking about 1/2 mile developed retrosternal chest pain that improved with rest. She has had SOB her whole life - allergies, but she feels that it has been getting worse lately, tried to run few years ago with significant chest burning. Denies syncope, orthopnea, PND, claudications.  She has never smoked. Mother died of MI at age 62.  Her stress test was negative, LDL 164, TG 213. She was started on simvastatin 40 mg po daily. No side effects. No more chest pains.  10/25/15 - 1 year follow up, she feels well, she has neck discopathy and upper extremity tingling. Remains active, no chest pain, DOE, no palpitations or syncope. She stopped using simvastatin as she doesn't like to take medicines.   02/04/2017 - 1 year follow up, she is doing ok, she had  a tough year, she lost 3 friends, she continues to experience dyspnea on exertion she feels that she can't take a full deep breath. Her exercise level has decreased as she is experiencing a lot of arthritic pain, especially in her knees and feet. She continues to walk 10 minutes a day. No chest pain. She continues to use red yeast rice. No dizziness syncope no claudications.  Past Medical History:  Diagnosis Date  . Allergy   . Cataracts, bilateral   . Diverticulosis of colon (without mention of hemorrhage) 2005  . Eczema   . Lactose intolerance    per pt   Past Surgical History:  Procedure Laterality Date  . CARPAL TUNNEL RELEASE Right   . VAGINAL HYSTERECTOMY     Current Outpatient Medications  Medication Sig Dispense Refill  .  estradiol (ESTRACE) 0.5 MG tablet Take 0.5 mg by mouth daily.  1  . ibuprofen (ADVIL,MOTRIN) 100 MG tablet Take 100 mg by mouth daily as needed (headaches).    . Multiple Vitamin (MULTIVITAMIN) tablet Take 1 tablet by mouth daily.    . Red Yeast Rice 600 MG CAPS Take 1 capsule (600 mg total) by mouth daily.     No current facility-administered medications for this visit.    Allergies:   Latex and Other   Social History:  The patient  reports that  has never smoked. she has never used smokeless tobacco. She reports that she does not drink alcohol or use drugs.   Family History:  The patient's family history includes Dementia in her father; Heart disease (age of onset: 17) in her mother; Hypertension in her mother.   ROS:  Please see the history of present illness.   Otherwise, review of systems are positive for none.   All other systems are reviewed and negative.   PHYSICAL EXAM: VS:  BP 130/80   Pulse 65   Ht 5\' 6"  (1.676 m)   Wt 154 lb 3.2 oz (69.9 kg)   SpO2 98%   BMI 24.89 kg/m  , BMI Body mass index is 24.89 kg/m. GEN: Well nourished, well developed, in no acute distress  HEENT: normal  Neck: no JVD, carotid bruits, or masses  Cardiac: RRR; no murmurs, rubs, or gallops,no edema  Respiratory:  clear to auscultation bilaterally, normal work of breathing GI: soft, nontender, nondistended, + BS MS: no deformity or atrophy  Skin: warm and dry, no rash Neuro:  Strength and sensation are intact Psych: euthymic mood, full affect  EKG:  EKG is ordered today. The ekg ordered today demonstrates SR, RBBB  Recent Labs: No results found for requested labs within last 8760 hours.   Lipid Panel    Component Value Date/Time   CHOL 156 09/12/2014 0918   CHOL 262 (H) 06/27/2014 1651   TRIG 80.0 09/12/2014 0918   TRIG 213 (H) 06/27/2014 1651   HDL 55.40 09/12/2014 0918   HDL 55 06/27/2014 1651   CHOLHDL 3 09/12/2014 0918   VLDL 16.0 09/12/2014 0918   LDLCALC 85 09/12/2014 0918    LDLCALC 164 (H) 06/27/2014 1651    Wt Readings from Last 3 Encounters:  02/04/17 154 lb 3.2 oz (69.9 kg)  10/25/15 155 lb (70.3 kg)  12/21/14 158 lb (71.7 kg)    Other studies Reviewed: Additional studies/ records that were reviewed today include: prior ECG Review of the above records demonstrates: ECG in 2012 SR, normal ECG  ECG: 02/04/2017, personally reviewed, shows sinus bradycardia with right bundle branch block, unchanged from prior.  TTE: 07/17/2014 - Left ventricle: The cavity size was normal. Wall thickness was normal. Systolic function was normal. The estimated ejection fraction was in the range of 60% to 65%. Wall motion was normal; there were no regional wall motion abnormalities. Left ventricular diastolic function parameters were normal.    ASSESSMENT AND PLAN:  1.  DOE - stable, unchanged ECG, normal exercise nuclear stress test in 11/2014.  Normal echocardiogram. We will order a chest x-ray and pulmonary function test and if abnormal consider referral to pulmonary and performing CT chest.  2. Hyperlipidemia - LDL 178, TG 213, we started on simvastatin 40 mg po daily a year ago with improvement of LDL to 85, TG to 80, however she discontinued for no good reason. She continues to take red yeast rice. We'll recheck lipids and liver function test now.  Follow up in 1 year.  Signed, Ena Dawley, MD  02/04/2017 8:40 AM    [facility] [place], [place], [place]  [address] Phone: [phone]; Fax: [phone]

## 2017-02-05 ENCOUNTER — Ambulatory Visit (INDEPENDENT_AMBULATORY_CARE_PROVIDER_SITE_OTHER): Payer: Medicare Other | Admitting: Internal Medicine

## 2017-02-05 DIAGNOSIS — R0609 Other forms of dyspnea: Secondary | ICD-10-CM

## 2017-02-05 LAB — PULMONARY FUNCTION TEST
DL/VA % pred: 72 %
DL/VA: 3.66 ml/min/mmHg/L
DLCO cor % pred: 72 %
DLCO cor: 19.51 ml/min/mmHg
DLCO unc % pred: 77 %
DLCO unc: 20.9 ml/min/mmHg
FEF 25-75 Post: 3.64 L/sec
FEF 25-75 Pre: 2.61 L/sec
FEF2575-%Change-Post: 39 %
FEF2575-%Pred-Post: 196 %
FEF2575-%Pred-Pre: 140 %
FEV1-%Change-Post: 7 %
FEV1-%Pred-Post: 127 %
FEV1-%Pred-Pre: 119 %
FEV1-Post: 3.01 L
FEV1-Pre: 2.8 L
FEV1FVC-%Change-Post: 9 %
FEV1FVC-%Pred-Pre: 104 %
FEV6-%Change-Post: 0 %
FEV6-%Pred-Post: 117 %
FEV6-%Pred-Pre: 118 %
FEV6-Post: 3.5 L
FEV6-Pre: 3.52 L
FEV6FVC-%Change-Post: 1 %
FEV6FVC-%Pred-Post: 105 %
FEV6FVC-%Pred-Pre: 103 %
FVC-%Change-Post: -1 %
FVC-%Pred-Post: 112 %
FVC-%Pred-Pre: 114 %
FVC-Post: 3.5 L
FVC-Pre: 3.57 L
Post FEV1/FVC ratio: 86 %
Post FEV6/FVC ratio: 100 %
Pre FEV1/FVC ratio: 79 %
Pre FEV6/FVC Ratio: 99 %
RV % pred: 96 %
RV: 2.27 L
TLC % pred: 107 %
TLC: 5.76 L

## 2017-02-05 NOTE — Progress Notes (Signed)
PFT done today. 

## 2017-02-06 ENCOUNTER — Other Ambulatory Visit: Payer: Medicare Other | Admitting: *Deleted

## 2017-02-06 DIAGNOSIS — E7849 Other hyperlipidemia: Secondary | ICD-10-CM | POA: Diagnosis not present

## 2017-02-06 DIAGNOSIS — R0609 Other forms of dyspnea: Secondary | ICD-10-CM | POA: Diagnosis not present

## 2017-02-07 LAB — CBC WITH DIFFERENTIAL/PLATELET
Basophils Absolute: 0 10*3/uL (ref 0.0–0.2)
Basos: 0 %
EOS (ABSOLUTE): 0.2 10*3/uL (ref 0.0–0.4)
Eos: 4 %
Hematocrit: 37.8 % (ref 34.0–46.6)
Hemoglobin: 13.3 g/dL (ref 11.1–15.9)
Immature Grans (Abs): 0 10*3/uL (ref 0.0–0.1)
Immature Granulocytes: 0 %
Lymphocytes Absolute: 1.6 10*3/uL (ref 0.7–3.1)
Lymphs: 34 %
MCH: 29.6 pg (ref 26.6–33.0)
MCHC: 35.2 g/dL (ref 31.5–35.7)
MCV: 84 fL (ref 79–97)
Monocytes Absolute: 0.2 10*3/uL (ref 0.1–0.9)
Monocytes: 5 %
Neutrophils Absolute: 2.6 10*3/uL (ref 1.4–7.0)
Neutrophils: 57 %
Platelets: 190 10*3/uL (ref 150–379)
RBC: 4.5 x10E6/uL (ref 3.77–5.28)
RDW: 15.1 % (ref 12.3–15.4)
WBC: 4.6 10*3/uL (ref 3.4–10.8)

## 2017-02-07 LAB — COMPREHENSIVE METABOLIC PANEL
ALT: 16 IU/L (ref 0–32)
AST: 22 IU/L (ref 0–40)
Albumin/Globulin Ratio: 2 (ref 1.2–2.2)
Albumin: 4.3 g/dL (ref 3.5–4.8)
Alkaline Phosphatase: 92 IU/L (ref 39–117)
BUN/Creatinine Ratio: 17 (ref 12–28)
BUN: 14 mg/dL (ref 8–27)
Bilirubin Total: 0.4 mg/dL (ref 0.0–1.2)
CO2: 25 mmol/L (ref 20–29)
Calcium: 9.1 mg/dL (ref 8.7–10.3)
Chloride: 105 mmol/L (ref 96–106)
Creatinine, Ser: 0.83 mg/dL (ref 0.57–1.00)
GFR calc Af Amer: 81 mL/min/{1.73_m2} (ref >59)
GFR calc non Af Amer: 70 mL/min/{1.73_m2} (ref >59)
Globulin, Total: 2.1 g/dL (ref 1.5–4.5)
Glucose: 95 mg/dL (ref 65–99)
Potassium: 4.1 mmol/L (ref 3.5–5.2)
Sodium: 142 mmol/L (ref 134–144)
Total Protein: 6.4 g/dL (ref 6.0–8.5)

## 2017-02-07 LAB — LIPID PANEL
Chol/HDL Ratio: 3.6 ratio (ref 0.0–4.4)
Cholesterol, Total: 224 mg/dL — ABNORMAL HIGH (ref 100–199)
HDL: 63 mg/dL (ref >39)
LDL Calculated: 145 mg/dL — ABNORMAL HIGH (ref 0–99)
Triglycerides: 80 mg/dL (ref 0–149)
VLDL Cholesterol Cal: 16 mg/dL (ref 5–40)

## 2017-02-27 ENCOUNTER — Ambulatory Visit (INDEPENDENT_AMBULATORY_CARE_PROVIDER_SITE_OTHER): Payer: Medicare Other | Admitting: Family Medicine

## 2017-02-27 ENCOUNTER — Encounter: Payer: Self-pay | Admitting: Family Medicine

## 2017-02-27 VITALS — BP 126/82 | HR 77 | Temp 98.6°F | Wt 155.6 lb

## 2017-02-27 DIAGNOSIS — Z79890 Hormone replacement therapy (postmenopausal): Secondary | ICD-10-CM

## 2017-02-27 DIAGNOSIS — L2084 Intrinsic (allergic) eczema: Secondary | ICD-10-CM

## 2017-02-27 DIAGNOSIS — E7800 Pure hypercholesterolemia, unspecified: Secondary | ICD-10-CM | POA: Diagnosis not present

## 2017-02-27 MED ORDER — ESTRADIOL 0.5 MG PO TABS: 0.5000 mg | ORAL_TABLET | Freq: Every day | ORAL | 5 refills | Status: AC

## 2017-02-27 NOTE — Progress Notes (Signed)
Beverly Carter is a 72 y.o. female is here to [facility].   Patient Care Team: Briscoe Deutscher, DO as PCP - General (Family Medicine)   History of Present Illness:   Lonell Grandchild, CMA acting as scribe for Dr. Briscoe Deutscher.   HPI:   Patient referred by Pulmonology.   She has concerns about the distance to office from her home. Her last PCP moved to [facility].   Generally heathy is seen by cardiology and pulmonology. She refused to stat statin drug as suggested by cardiology. She is currently on HRT. She would like to have that refilled. Ok to refill today for short term but will need to be seen by GYN in the future for refills.   She is taking Topicort 1 time a month or less for dermatitis around eyes. She was informed that steroid use around eyes can lead to glaucoma.   Health Maintenance Due  Topic Date Due  . Hepatitis C Screening  [birth year]   Depression screen PHQ 2/9 02/27/2017  Decreased Interest 0  Down, Depressed, Hopeless 0  PHQ - 2 Score 0   PMHx, SurgHx, SocialHx, Medications, and Allergies were reviewed in the Visit Navigator and updated as appropriate.   Past Medical History:  Diagnosis Date  . Allergy   . Cataracts, bilateral   . Diverticulosis of colon (without mention of hemorrhage) 2005  . Eczema   . Lactose intolerance    per pt   Past Surgical History:  Procedure Laterality Date  . CARPAL TUNNEL RELEASE Right   . VAGINAL HYSTERECTOMY     Family History  Problem Relation Age of Onset  . Heart disease Mother 2       MI  . Colon cancer Neg Hx   . Hypertension Mother   . Dementia Father    Social History   Tobacco Use  . Smoking status: Never Smoker  . Smokeless tobacco: Never Used  Substance Use Topics  . Alcohol use: No  . Drug use: No   Current Medications and Allergies:   .  desoximetasone (TOPICORT) 0.05 % cream, Apply 1 application topically 2 (two) times daily., Disp: , Rfl:  .  estradiol (ESTRACE) 0.5 MG tablet,  Take 0.5 mg by mouth daily., Disp: , Rfl: 1 .  ibuprofen (ADVIL,MOTRIN) 100 MG tablet, Take 100 mg by mouth daily as needed (headaches)., Disp: , Rfl:  .  Multiple Vitamin (MULTIVITAMIN) tablet, Take 1 tablet by mouth daily., Disp: , Rfl:  .  Red Yeast Rice 600 MG CAPS, Take 1 capsule (600 mg total) by mouth daily., Disp: , Rfl:  .  vitamin C (ASCORBIC ACID) 500 MG tablet, Take 500 mg by mouth daily., Disp: , Rfl:   Allergies  Allergen Reactions  . Latex     itching  . Other     Cigarette smoke- allergies    Review of Systems:   Pertinent items are noted in the HPI. Otherwise, ROS is negative.  Vitals:   Vitals:   02/27/17 1040  BP: 126/82  Pulse: 77  Temp: 98.6 F (37 C)  TempSrc: Oral  SpO2: 95%  Weight: 155 lb 9.6 oz (70.6 kg)     Body mass index is 25.11 kg/m.  Physical Exam:   Physical Exam  Constitutional: She appears well-developed and well-nourished. No distress.  HENT:  Head: Normocephalic and atraumatic.  Eyes: EOM are normal. Pupils are equal, round, and reactive to light.  Neck: Normal range of motion. Neck supple.  Cardiovascular:  Normal rate, regular rhythm, normal heart sounds and intact distal pulses.  Pulmonary/Chest: Effort normal.  Abdominal: Soft.  Skin: Skin is warm.  Psychiatric: She has a normal mood and affect. Her behavior is normal.  Nursing note and vitals reviewed.  Results for orders placed or performed in visit on 02/06/17  Lipid Profile  Result Value Ref Range   Cholesterol, Total 224 (H) 100 - 199 mg/dL   Triglycerides 80 0 - 149 mg/dL   HDL 63 >39 mg/dL   VLDL Cholesterol Cal 16 5 - 40 mg/dL   LDL Calculated 145 (H) 0 - 99 mg/dL   Chol/HDL Ratio 3.6 0.0 - 4.4 ratio  CBC w/Diff  Result Value Ref Range   WBC 4.6 3.4 - 10.8 x10E3/uL   RBC 4.50 3.77 - 5.28 x10E6/uL   Hemoglobin 13.3 11.1 - 15.9 g/dL   Hematocrit 37.8 34.0 - 46.6 %   MCV 84 79 - 97 fL   MCH 29.6 26.6 - 33.0 pg   MCHC 35.2 31.5 - 35.7 g/dL   RDW 15.1 12.3 -  15.4 %   Platelets 190 150 - 379 x10E3/uL   Neutrophils 57 Not Estab. %   Lymphs 34 Not Estab. %   Monocytes 5 Not Estab. %   Eos 4 Not Estab. %   Basos 0 Not Estab. %   Neutrophils Absolute 2.6 1.4 - 7.0 x10E3/uL   Lymphocytes Absolute 1.6 0.7 - 3.1 x10E3/uL   Monocytes Absolute 0.2 0.1 - 0.9 x10E3/uL   EOS (ABSOLUTE) 0.2 0.0 - 0.4 x10E3/uL   Basophils Absolute 0.0 0.0 - 0.2 x10E3/uL   Immature Granulocytes 0 Not Estab. %   Immature Grans (Abs) 0.0 0.0 - 0.1 x10E3/uL  Comp Met (CMET)  Result Value Ref Range   Glucose 95 65 - 99 mg/dL   BUN 14 8 - 27 mg/dL   Creatinine, Ser 0.83 0.57 - 1.00 mg/dL   GFR calc non Af Amer 70 >59 mL/min/1.73   GFR calc Af Amer 81 >59 mL/min/1.73   BUN/Creatinine Ratio 17 12 - 28   Sodium 142 134 - 144 mmol/L   Potassium 4.1 3.5 - 5.2 mmol/L   Chloride 105 96 - 106 mmol/L   CO2 25 20 - 29 mmol/L   Calcium 9.1 8.7 - 10.3 mg/dL   Total Protein 6.4 6.0 - 8.5 g/dL   Albumin 4.3 3.5 - 4.8 g/dL   Globulin, Total 2.1 1.5 - 4.5 g/dL   Albumin/Globulin Ratio 2.0 1.2 - 2.2   Bilirubin Total 0.4 0.0 - 1.2 mg/dL   Alkaline Phosphatase 92 39 - 117 IU/L   AST 22 0 - 40 IU/L   ALT 16 0 - 32 IU/L   Assessment and Plan:   1. Hormone replacement therapy (HRT) Patient upset that I will not continue to prescribe. I support her decision to take HRT, but don't prescribed it long-term. To GYN. Note: Patient stated multiple times that she prefers female physicians. I will try to refer her to one. I also offered to direct her to a female physician in [place].   - Ambulatory referral to Obstetrics / Gynecology - estradiol (ESTRACE) 0.5 MG tablet; Take 1 tablet (0.5 mg total) by mouth daily.  Dispense: 30 tablet; Refill: 5  2. Pure hypercholesterolemia Patient refuses statin.  3. Intrinsic eczema Okay refill. I advised her NOT to use it on the face.   - desoximetasone (TOPICORT) 0.05 % cream; Apply 1 application topically 2 (two) times daily.   . Reviewed  expectations re: course of current medical issues. . Discussed self-management of symptoms. . Outlined signs and symptoms indicating need for more acute intervention. . Patient verbalized understanding and all questions were answered. Marland Kitchen Health Maintenance issues including appropriate healthy diet, exercise, and smoking avoidance were discussed with patient. . See orders for this visit as documented in the electronic medical record. . Patient received an After Visit Summary.  CMA served as Education administrator during this visit. History, Physical, and Plan performed by medical provider. The above documentation has been reviewed and is accurate and complete. Briscoe Deutscher, D.O.    Briscoe Deutscher, DO [place], [place] 03/06/2017

## 2017-03-02 ENCOUNTER — Telehealth: Payer: Self-pay | Admitting: Family Medicine

## 2017-03-02 NOTE — Telephone Encounter (Signed)
Spoke with patient and she stated that OB called her and for an appointment. She stated that they was seeing her for postmenopausal bleeding. I explained that we put the referral in for postmenopause HRT

## 2017-03-02 NOTE — Telephone Encounter (Signed)
Please contact patient to advise and gather additional information.   Copied from [place] [id number]. Topic: General - Other >> Mar 02, 2017 12:19 PM Carolyn Stare wrote:  Pt called and  ask for a call back. The only info she would give me was that it is concerning something in her records. Call patient at [phone]

## 2017-03-06 DIAGNOSIS — Z79890 Hormone replacement therapy (postmenopausal): Secondary | ICD-10-CM | POA: Insufficient documentation

## 2017-03-06 DIAGNOSIS — E7800 Pure hypercholesterolemia, unspecified: Secondary | ICD-10-CM | POA: Insufficient documentation

## 2017-05-19 ENCOUNTER — Encounter: Payer: Self-pay | Admitting: Podiatry

## 2017-05-19 ENCOUNTER — Ambulatory Visit (INDEPENDENT_AMBULATORY_CARE_PROVIDER_SITE_OTHER): Payer: Medicare Other | Admitting: Podiatry

## 2017-05-19 ENCOUNTER — Ambulatory Visit (INDEPENDENT_AMBULATORY_CARE_PROVIDER_SITE_OTHER): Payer: Medicare Other

## 2017-05-19 VITALS — BP 129/74 | HR 67 | Resp 16

## 2017-05-19 DIAGNOSIS — M205X2 Other deformities of toe(s) (acquired), left foot: Secondary | ICD-10-CM

## 2017-05-19 DIAGNOSIS — M779 Enthesopathy, unspecified: Secondary | ICD-10-CM

## 2017-05-19 DIAGNOSIS — M778 Other enthesopathies, not elsewhere classified: Secondary | ICD-10-CM

## 2017-05-19 MED ORDER — DICLOFENAC SODIUM 1 % TD GEL: 4.0000 g | Freq: Four times a day (QID) | TRANSDERMAL | 2 refills | Status: DC

## 2017-05-19 NOTE — Progress Notes (Signed)
Subjective:  Patient ID: Beverly Carter, female    DOB: 22-Aug-1945,  MRN: [id number] HPI Chief Complaint  Patient presents with  . Foot Pain    Dorsal foot left - sharp pains x years, been intermittent sensations since 2012, but more constant over the last few months, swelling, tried different shoes    72 y.o. female presents with the above complaint.   ROS: Denies fever chills nausea vomiting muscle aches pains shortness of breath calf pain chest pain back pain headaches.  Past Medical History:  Diagnosis Date  . Allergy   . Arthritis   . Cataracts, bilateral   . Diverticulosis of colon (without mention of hemorrhage) 2005  . Eczema   . Lactose intolerance    per pt   Past Surgical History:  Procedure Laterality Date  . CARPAL TUNNEL RELEASE Right   . VAGINAL HYSTERECTOMY      Current Outpatient Medications:  .  desoximetasone (TOPICORT) 0.05 % cream, Apply 1 application topically 2 (two) times daily., Disp: , Rfl:  .  diclofenac sodium (VOLTAREN) 1 % GEL, Apply 4 g topically 4 (four) times daily., Disp: 100 g, Rfl: 2 .  estradiol (ESTRACE) 0.5 MG tablet, Take 1 tablet (0.5 mg total) by mouth daily., Disp: 30 tablet, Rfl: 5 .  ibuprofen (ADVIL,MOTRIN) 100 MG tablet, Take 100 mg by mouth daily as needed (headaches)., Disp: , Rfl:  .  Multiple Vitamin (MULTIVITAMIN) tablet, Take 1 tablet by mouth daily., Disp: , Rfl:  .  Red Yeast Rice 600 MG CAPS, Take 1 capsule (600 mg total) by mouth daily., Disp: , Rfl:  .  vitamin C (ASCORBIC ACID) 500 MG tablet, Take 500 mg by mouth daily., Disp: , Rfl:   Allergies  Allergen Reactions  . Latex     itching  . Other     Cigarette smoke- allergies    Review of Systems Objective:   Vitals:   05/19/17 1120  BP: 129/74  Pulse: 67  Resp: 16    General: Well developed, nourished, in no acute distress, alert and oriented x3   Dermatological: Skin is warm, dry and supple bilateral. Nails x 10 are well maintained; remaining  integument appears unremarkable at this time. There are no open sores, no preulcerative lesions, no rash or signs of infection present.  Vascular: Dorsalis Pedis artery and Posterior Tibial artery pedal pulses are 2/4 bilateral with immedate capillary fill time. Pedal hair growth present. No varicosities and no lower extremity edema present bilateral.   Neruologic: Grossly intact via light touch bilateral. Vibratory intact via tuning fork bilateral. Protective threshold with Semmes Wienstein monofilament intact to all pedal sites bilateral. Patellar and Achilles deep tendon reflexes 2+ bilateral. No Babinski or clonus noted bilateral.  She has tenderness on palpation of the deep peroneal nerve dorsal aspect of the left foot.  Suspected osteoarthritic change beneath the nerve.  Musculoskeletal: No gross boney pedal deformities bilateral. No pain, crepitus, or limitation noted with foot and ankle range of motion bilateral. Muscular strength 5/5 in all groups tested bilateral.  She has pain on palpation of the second and third tarsometatarsal joints with dorsal spurring present.  She has pain in the same area on fluoroscopy frontal plane range of motion particularly eversion.  She has full range of motion though tender on end range of motion of the first metatarsophalangeal joint left.  Mild hallux valgus is noted.  Gait: Unassisted, Nonantalgic.    Radiographs:  Radiographs of the left foot demonstrate an osseously mature  individual with significant osteoarthritis to the second and third tarsometatarsal joints as well as to the first metatarsophalangeal joint.  Assessment & Plan:   Assessment: Capsulitis osteoarthritis and neuritis left.  Plan: Discussed etiology pathology conservative versus surgical therapies.  Offered her cortisone injection but she declined today.  We discussed appropriate shoe gear stretching exercises ice therapy as your modifications.  Discussed the possible need for surgical  intervention regarding the first metatarsophalangeal joint with a Keller arthroplasty.  She understands this is amenable to it if necessary.  We also discussed the possible need for future injections to the second tarsometatarsal joint she understands that.     Beverly Carter, [place]

## 2017-05-26 ENCOUNTER — Encounter: Payer: Self-pay | Admitting: Podiatry

## 2017-06-19 DIAGNOSIS — Z1159 Encounter for screening for other viral diseases: Secondary | ICD-10-CM | POA: Diagnosis not present

## 2017-06-19 DIAGNOSIS — Z79890 Hormone replacement therapy (postmenopausal): Secondary | ICD-10-CM | POA: Diagnosis not present

## 2017-06-19 LAB — HM HEPATITIS C SCREENING LAB: HM Hepatitis Screen: NEGATIVE

## 2017-06-23 ENCOUNTER — Other Ambulatory Visit: Payer: Self-pay | Admitting: Family Medicine

## 2017-06-23 DIAGNOSIS — Z1231 Encounter for screening mammogram for malignant neoplasm of breast: Secondary | ICD-10-CM

## 2017-06-24 ENCOUNTER — Telehealth: Payer: Self-pay | Admitting: Family Medicine

## 2017-06-24 NOTE — Telephone Encounter (Signed)
See note.   Copied from [place] [id number]. Topic: Inquiry >> Jun 24, 2017 10:32 AM Pricilla Handler wrote: Reason for CRM: Patient called stating that she is sending her Hep C Screening Test results to our office by mail.       Thank You!!!

## 2017-07-01 ENCOUNTER — Encounter: Payer: Self-pay | Admitting: Surgical

## 2017-07-20 ENCOUNTER — Ambulatory Visit: Payer: Medicare Other

## 2017-07-20 ENCOUNTER — Other Ambulatory Visit: Payer: Self-pay | Admitting: Family Medicine

## 2017-07-20 ENCOUNTER — Ambulatory Visit
Admission: RE | Admit: 2017-07-20 | Discharge: 2017-07-20 | Disposition: A | Discharge status: Home / Self Care | Payer: Medicare Other | Source: Ambulatory Visit | Attending: Family Medicine | Admitting: Family Medicine

## 2017-07-20 DIAGNOSIS — Z1231 Encounter for screening mammogram for malignant neoplasm of breast: Secondary | ICD-10-CM

## 2017-07-20 DIAGNOSIS — N644 Mastodynia: Secondary | ICD-10-CM

## 2017-07-22 ENCOUNTER — Telehealth: Payer: Self-pay | Admitting: Family Medicine

## 2017-07-22 NOTE — Telephone Encounter (Signed)
See note.   Copied from [place] [id number]. Topic: Quick Communication - See Telephone Encounter >> Jul 22, 2017 10:17 AM Marja Kays F wrote: Pt states that she went Monday to try and get her annual mammogram but becasuse she told the tech she was having an issue with her breast she is needing an new order  For a diagnostic mamogram  Best number [phone]

## 2017-07-22 NOTE — Telephone Encounter (Signed)
Notified patient that the ordered has already been placed through the breast center. Patient is going to call and get it scheduled.

## 2017-07-23 ENCOUNTER — Ambulatory Visit: Payer: Medicare Other

## 2017-07-23 ENCOUNTER — Ambulatory Visit
Admission: RE | Admit: 2017-07-23 | Discharge: 2017-07-23 | Disposition: A | Discharge status: Home / Self Care | Payer: Medicare Other | Source: Ambulatory Visit | Attending: Family Medicine | Admitting: Family Medicine

## 2017-07-23 DIAGNOSIS — N644 Mastodynia: Secondary | ICD-10-CM

## 2017-07-23 DIAGNOSIS — R928 Other abnormal and inconclusive findings on diagnostic imaging of breast: Secondary | ICD-10-CM | POA: Diagnosis not present

## 2017-08-20 ENCOUNTER — Encounter (INDEPENDENT_AMBULATORY_CARE_PROVIDER_SITE_OTHER): Payer: Self-pay

## 2017-08-20 DIAGNOSIS — H2513 Age-related nuclear cataract, bilateral: Secondary | ICD-10-CM | POA: Diagnosis not present

## 2017-08-20 DIAGNOSIS — H52223 Regular astigmatism, bilateral: Secondary | ICD-10-CM | POA: Diagnosis not present

## 2017-08-20 DIAGNOSIS — H524 Presbyopia: Secondary | ICD-10-CM | POA: Diagnosis not present

## 2017-08-20 DIAGNOSIS — H5203 Hypermetropia, bilateral: Secondary | ICD-10-CM | POA: Diagnosis not present

## 2017-08-27 ENCOUNTER — Ambulatory Visit: Payer: Medicare Other | Admitting: Family Medicine

## 2017-08-28 ENCOUNTER — Ambulatory Visit: Payer: Medicare Other | Admitting: Family Medicine

## 2017-10-07 DIAGNOSIS — B373 Candidiasis of vulva and vagina: Secondary | ICD-10-CM | POA: Diagnosis not present

## 2017-10-07 DIAGNOSIS — Z124 Encounter for screening for malignant neoplasm of cervix: Secondary | ICD-10-CM | POA: Diagnosis not present

## 2017-10-07 DIAGNOSIS — Z79890 Hormone replacement therapy (postmenopausal): Secondary | ICD-10-CM | POA: Diagnosis not present

## 2017-12-01 DIAGNOSIS — M542 Cervicalgia: Secondary | ICD-10-CM | POA: Diagnosis not present

## 2018-01-04 DIAGNOSIS — H16141 Punctate keratitis, right eye: Secondary | ICD-10-CM | POA: Diagnosis not present

## 2018-03-13 ENCOUNTER — Encounter (HOSPITAL_COMMUNITY): Payer: Self-pay | Admitting: Emergency Medicine

## 2018-03-13 ENCOUNTER — Ambulatory Visit (HOSPITAL_COMMUNITY)
Admission: EM | Admit: 2018-03-13 | Discharge: 2018-03-13 | Disposition: A | Discharge status: Home / Self Care | Payer: Medicare Other | Attending: Family Medicine | Admitting: Family Medicine

## 2018-03-13 DIAGNOSIS — R69 Illness, unspecified: Secondary | ICD-10-CM | POA: Diagnosis not present

## 2018-03-13 DIAGNOSIS — J111 Influenza due to unidentified influenza virus with other respiratory manifestations: Secondary | ICD-10-CM

## 2018-03-13 LAB — COMPREHENSIVE METABOLIC PANEL
ALT: 16 U/L (ref 0–44)
AST: 20 U/L (ref 15–41)
Albumin: 4.2 g/dL (ref 3.5–5.0)
Alkaline Phosphatase: 66 U/L (ref 38–126)
Anion gap: 14 (ref 5–15)
BUN: 14 mg/dL (ref 8–23)
CO2: 26 mmol/L (ref 22–32)
Calcium: 9.6 mg/dL (ref 8.9–10.3)
Chloride: 95 mmol/L — ABNORMAL LOW (ref 98–111)
Creatinine, Ser: 0.91 mg/dL (ref 0.44–1.00)
Glucose, Bld: 113 mg/dL — ABNORMAL HIGH (ref 70–99)
POTASSIUM: 4.3 mmol/L (ref 3.5–5.1)
SODIUM: 135 mmol/L (ref 135–145)
TOTAL PROTEIN: 6.8 g/dL (ref 6.5–8.1)
Total Bilirubin: 0.9 mg/dL (ref 0.3–1.2)

## 2018-03-13 LAB — CBC WITH DIFFERENTIAL/PLATELET
ABS IMMATURE GRANULOCYTES: 0.04 10*3/uL (ref 0.00–0.07)
BASOS ABS: 0 10*3/uL (ref 0.0–0.1)
BASOS PCT: 0 %
EOS ABS: 0 10*3/uL (ref 0.0–0.5)
Eosinophils Relative: 0 %
HCT: 41.6 % (ref 36.0–46.0)
Hemoglobin: 13.9 g/dL (ref 12.0–15.0)
IMMATURE GRANULOCYTES: 0 %
Lymphocytes Relative: 15 %
Lymphs Abs: 1.4 10*3/uL (ref 0.7–4.0)
MCH: 29.4 pg (ref 26.0–34.0)
MCHC: 33.4 g/dL (ref 30.0–36.0)
MCV: 88.1 fL (ref 80.0–100.0)
MONOS PCT: 6 %
Monocytes Absolute: 0.6 10*3/uL (ref 0.1–1.0)
NEUTROS ABS: 7 10*3/uL (ref 1.7–7.7)
NEUTROS PCT: 79 %
NRBC: 0 % (ref 0.0–0.2)
Platelets: 250 10*3/uL (ref 150–400)
RBC: 4.72 MIL/uL (ref 3.87–5.11)
RDW: 13.2 % (ref 11.5–15.5)
WBC: 9.1 10*3/uL (ref 4.0–10.5)

## 2018-03-13 LAB — CK: Total CK: 75 U/L (ref 38–234)

## 2018-03-13 NOTE — ED Provider Notes (Signed)
[facility]    CSN: [id number] Arrival date & time: 03/13/18  1020     History   Chief Complaint Chief Complaint  Patient presents with  . Fever  . Headache    HPI Salle Brandle is a 73 y.o. female.   HPI  triage note:Pt sts fever and HA x 3 weeks; pt sts seen PCP x 3 without resolution; pt sts taking keflex, pred and fluconazole  Patient states that she felt well on January 19.  On January 20 she woke up with fever, chills, body aches, and sore throat.  She went to as a walk-in to her primary care clinic at [facility] on 02/27/2018.  Flu test was negative.  Strep test was negative.  She was given Augmentin, Tamiflu, and fluconazole in case she developed a yeast infection.  She failed to improve and went back on 02/27/2018.  She was given in addition prednisone.  She failed to improve so was seen again on 03/09/2018.  At this visit she was given Keflex. Patient states she has persistent low-grade temperature.  Body aches.  Fatigue.  She has some cough but not prominent.  Her sinus and runny nose symptoms have gotten better.  She continues to have headache.  She feels weak. She states that her chest x-ray was done at 1 of the visits.  I called [facility] for that result and it appears that she had a normal sinus series.  No rash.  No joint aches.  Nausea vomiting diarrhea.  Appetite is normal.  Weight is stable.  No urinary symptoms.   Past Medical History:  Diagnosis Date  . Allergy   . Arthritis   . Cataracts, bilateral   . Diverticulosis of colon (without mention of hemorrhage) 2005  . Eczema   . Lactose intolerance    per pt    Patient Active Problem List   Diagnosis Date Noted  . Pure hypercholesterolemia 03/06/2017  . Hormone replacement therapy (HRT) 03/06/2017  . Arthritis of carpometacarpal (CMC) joints of both thumbs 03/20/2016  . Bilateral carpal tunnel syndrome 03/20/2016  . Vulvar atrophy 08/03/2014  . DOE (dyspnea on exertion) 06/27/2014   . Chest pain 06/27/2014  . Meniscus tear 12/13/2013  . Diarrhea 05/23/2013  . Colon cancer screening 05/23/2013  . Hyperlipidemia 03/09/2013  . Atypical nevus of back 09/12/2012  . Contact dermatitis 08/30/2012  . Allergy   . Eczema   . Lactose intolerance   . Postmenopausal HRT (hormone replacement therapy) 06/01/2012    Past Surgical History:  Procedure Laterality Date  . CARPAL TUNNEL RELEASE Right   . VAGINAL HYSTERECTOMY      OB History   No obstetric history on file.      Home Medications    Prior to Admission medications   Medication Sig Start Date End Date Taking? Authorizing Provider  desoximetasone (TOPICORT) 0.05 % cream Apply 1 application topically 2 (two) times daily.    [provider]  diclofenac sodium (VOLTAREN) 1 % GEL Apply 4 g topically 4 (four) times daily. 05/19/17   Hyatt, Max T, DPM  estradiol (ESTRACE) 0.5 MG tablet Take 1 tablet (0.5 mg total) by mouth daily. 02/27/17   Briscoe Deutscher, DO  Multiple Vitamin (MULTIVITAMIN) tablet Take 1 tablet by mouth daily.    [provider]  Red Yeast Rice 600 MG CAPS Take 1 capsule (600 mg total) by mouth daily. 10/25/15   Dorothy Spark, MD  vitamin C (ASCORBIC ACID) 500 MG tablet Take  500 mg by mouth daily.    [provider]    Family History Family History  Problem Relation Age of Onset  . Heart disease Mother 54       MI  . Hypertension Mother   . Dementia Father   . Colon cancer Neg Hx     Social History Social History   Tobacco Use  . Smoking status: Never Smoker  . Smokeless tobacco: Never Used  Substance Use Topics  . Alcohol use: No  . Drug use: No     Allergies   Latex and Other   Review of Systems Review of Systems  Constitutional: Positive for chills, fatigue and fever.  HENT: Negative for ear pain and sore throat.   Eyes: Negative for pain and visual disturbance.  Respiratory: Positive for cough. Negative for shortness of breath.     Cardiovascular: Negative for chest pain and palpitations.  Gastrointestinal: Negative for abdominal pain and vomiting.  Genitourinary: Negative for dysuria and hematuria.  Musculoskeletal: Positive for myalgias. Negative for arthralgias and back pain.  Skin: Negative for color change and rash.  Neurological: Positive for headaches. Negative for seizures and syncope.  All other systems reviewed and are negative.    Physical Exam Triage Vital Signs ED Triage Vitals  Enc Vitals Group     BP 03/13/18 1102 (!) 145/76     Pulse Rate 03/13/18 1102 95     Resp 03/13/18 1102 18     Temp 03/13/18 1102 99.4 F (37.4 C)     Temp Source 03/13/18 1102 Oral     SpO2 03/13/18 1102 98 %     Weight --      Height --      Head Circumference --      Peak Flow --      Pain Score 03/13/18 1103 6     Pain Loc --      Pain Edu? --      Excl. in [place]? --    No data found.  Updated Vital Signs BP (!) 145/76 (BP Location: Left Arm)   Pulse 95   Temp 99.4 F (37.4 C) (Oral)   Resp 18   SpO2 98%   Visual Acuity Right Eye Distance:   Left Eye Distance:   Bilateral Distance:    Right Eye Near:   Left Eye Near:    Bilateral Near:     Physical Exam Constitutional:      General: She is not in acute distress.    Appearance: She is well-developed. She is not ill-appearing.     Comments: Anxious.  Talkative.  Appears younger than stated age  HENT:     Head: Normocephalic and atraumatic.     Mouth/Throat:     Mouth: Mucous membranes are moist.  Eyes:     Extraocular Movements: Extraocular movements intact.     Right eye: Normal extraocular motion.     Left eye: Normal extraocular motion.     Conjunctiva/sclera: Conjunctivae normal.     Pupils: Pupils are equal, round, and reactive to light.  Neck:     Musculoskeletal: Normal range of motion and neck supple.  Cardiovascular:     Rate and Rhythm: Normal rate and regular rhythm.     Heart sounds: Normal heart sounds.  Pulmonary:      Effort: Pulmonary effort is normal. No respiratory distress.     Breath sounds: Normal breath sounds. No wheezing, rhonchi or rales.  Abdominal:     General:  Bowel sounds are normal. There is no distension.     Palpations: Abdomen is soft.     Comments: No organomegaly  Musculoskeletal: Normal range of motion.  Lymphadenopathy:     Cervical: No cervical adenopathy.  Skin:    General: Skin is warm and dry.  Neurological:     General: No focal deficit present.     Mental Status: She is alert. Mental status is at baseline.     Coordination: Coordination normal.     Gait: Gait normal.     Deep Tendon Reflexes: Reflexes normal.  Psychiatric:        Mood and Affect: Mood normal.        Thought Content: Thought content normal.      UC Treatments / Results  Labs (all labs ordered are listed, but only abnormal results are displayed) Labs Reviewed  COMPREHENSIVE METABOLIC PANEL - Abnormal; Notable for the following components:      Result Value   Chloride 95 (*)    Glucose, Bld 113 (*)    All other components within normal limits  CBC WITH DIFFERENTIAL/PLATELET  CK    EKG None  Radiology No results found.  Procedures Procedures (including critical care time)  Medications Ordered in UC Medications - No data to display  Initial Impression / Assessment and Plan / UC Course  I have reviewed the triage vital signs and the nursing notes.  Pertinent labs & imaging results that were available during my care of the patient were reviewed by me and considered in my medical decision making (see chart for details).     Patient is eager to know what is wrong with her.  I explained to her that I can do some testing and come up with an pinon.  I told her that complicated medical problems have already seen another provider or not the best diagnoses to bring to an urgent care center.  All her tests are negative.  Her examination is reasonably normal.  I believe she has a post influenza  syndrome with prolonged body aches and fatigue.  She did not get a flu shot.  I understand that her flu test was negative, however, the rapid swabs do have a high rate of false negatives. Final Clinical Impressions(s) / UC Diagnoses   Final diagnoses:  Influenza-like illness     Discharge Instructions     Push fluids I am doing lab tests I will call later today before I leave with the tests available so far I believe you have a post influenza syndrome This can last for weeks Treat with anti inflammatory medicine like ibuprofen 800 mg or aleve 2 tabs 2 x a day May need follow up with infection specialist   ED Prescriptions    None     Controlled Substance Prescriptions [place] Controlled Substance Registry consulted? no   Raylene Everts, MD 03/13/18 1422

## 2018-03-13 NOTE — Discharge Instructions (Addendum)
Push fluids I am doing lab tests I will call later today before I leave with the tests available so far I believe you have a post influenza syndrome This can last for weeks Treat with anti inflammatory medicine like ibuprofen 800 mg or aleve 2 tabs 2 x a day May need follow up with infection specialist

## 2018-03-13 NOTE — ED Triage Notes (Signed)
Pt sts fever and HA x 3 weeks; pt sts seen PCP x 3 without resolution; pt sts taking keflex, pred and fluconazole

## 2018-03-19 ENCOUNTER — Encounter (HOSPITAL_COMMUNITY): Payer: Self-pay

## 2018-03-19 ENCOUNTER — Encounter (HOSPITAL_COMMUNITY): Payer: Self-pay | Admitting: Emergency Medicine

## 2018-03-19 ENCOUNTER — Emergency Department (HOSPITAL_COMMUNITY): Payer: Medicare Other

## 2018-03-19 ENCOUNTER — Ambulatory Visit (INDEPENDENT_AMBULATORY_CARE_PROVIDER_SITE_OTHER)
Admission: EM | Admit: 2018-03-19 | Discharge: 2018-03-19 | Disposition: A | Discharge status: Home / Self Care | Payer: Medicare Other | Source: Home / Self Care

## 2018-03-19 ENCOUNTER — Emergency Department (HOSPITAL_COMMUNITY)
Admission: EM | Admit: 2018-03-19 | Discharge: 2018-03-19 | Disposition: A | Discharge status: Home / Self Care | Payer: Medicare Other | Attending: Emergency Medicine | Admitting: Emergency Medicine

## 2018-03-19 DIAGNOSIS — R441 Visual hallucinations: Secondary | ICD-10-CM

## 2018-03-19 DIAGNOSIS — G43009 Migraine without aura, not intractable, without status migrainosus: Secondary | ICD-10-CM | POA: Insufficient documentation

## 2018-03-19 DIAGNOSIS — Z79899 Other long term (current) drug therapy: Secondary | ICD-10-CM | POA: Insufficient documentation

## 2018-03-19 DIAGNOSIS — R443 Hallucinations, unspecified: Secondary | ICD-10-CM

## 2018-03-19 DIAGNOSIS — R51 Headache: Secondary | ICD-10-CM | POA: Diagnosis present

## 2018-03-19 LAB — COMPREHENSIVE METABOLIC PANEL
ALT: 12 U/L (ref 0–44)
AST: 20 U/L (ref 15–41)
Albumin: 4.1 g/dL (ref 3.5–5.0)
Alkaline Phosphatase: 64 U/L (ref 38–126)
Anion gap: 14 (ref 5–15)
BUN: 10 mg/dL (ref 8–23)
CALCIUM: 9.5 mg/dL (ref 8.9–10.3)
CO2: 22 mmol/L (ref 22–32)
Chloride: 99 mmol/L (ref 98–111)
Creatinine, Ser: 0.93 mg/dL (ref 0.44–1.00)
GFR calc non Af Amer: >60 mL/min (ref >60)
Glucose, Bld: 103 mg/dL — ABNORMAL HIGH (ref 70–99)
Potassium: 3.6 mmol/L (ref 3.5–5.1)
Sodium: 135 mmol/L (ref 135–145)
Total Bilirubin: 0.8 mg/dL (ref 0.3–1.2)
Total Protein: 6.5 g/dL (ref 6.5–8.1)

## 2018-03-19 LAB — CBC
HCT: 40 % (ref 36.0–46.0)
Hemoglobin: 13.2 g/dL (ref 12.0–15.0)
MCH: 29.3 pg (ref 26.0–34.0)
MCHC: 33 g/dL (ref 30.0–36.0)
MCV: 88.9 fL (ref 80.0–100.0)
Platelets: 210 10*3/uL (ref 150–400)
RBC: 4.5 MIL/uL (ref 3.87–5.11)
RDW: 12.9 % (ref 11.5–15.5)
WBC: 8.4 10*3/uL (ref 4.0–10.5)
nRBC: 0 % (ref 0.0–0.2)

## 2018-03-19 MED ORDER — SODIUM CHLORIDE 0.9% FLUSH: 3.0000 mL | Freq: Once | INTRAVENOUS | Status: DC

## 2018-03-19 NOTE — Discharge Instructions (Addendum)
This may be a transient secondary effect of the Tamiflu, but the persistence of the symptoms combined with extreme fatigue and headache warrant further investigation today in the emergency department.

## 2018-03-19 NOTE — ED Provider Notes (Signed)
[facility]    CSN: [id number] Arrival date & time: 03/19/18  1053     History   Chief Complaint Chief Complaint  Patient presents with  . Hallucinations    HPI Beverly Carter is a 73 y.o. female.   73 yo with changed mental status since having taken Tamiflu 2 1/2weeks ago.  She was also given Diflucan at that time.   Patient reports hallucinations began during the course of Tamiflu.  She was also given steroids in shot and pill form.  She continues to have global headaches (worse when bending over) and extreme weakness.  No loss of balance, double vision, trouble swallowing.  She has persisting nausea without vomiting.  She notes seeing people (both genders, all ages) close to her, 5-6 times this morning.  Does not use illicit substances, occasional glass of wine.     Past Medical History:  Diagnosis Date  . Allergy   . Arthritis   . Cataracts, bilateral   . Diverticulosis of colon (without mention of hemorrhage) 2005  . Eczema   . Lactose intolerance    per pt    Patient Active Problem List   Diagnosis Date Noted  . Pure hypercholesterolemia 03/06/2017  . Hormone replacement therapy (HRT) 03/06/2017  . Arthritis of carpometacarpal (CMC) joints of both thumbs 03/20/2016  . Bilateral carpal tunnel syndrome 03/20/2016  . Vulvar atrophy 08/03/2014  . DOE (dyspnea on exertion) 06/27/2014  . Chest pain 06/27/2014  . Meniscus tear 12/13/2013  . Diarrhea 05/23/2013  . Colon cancer screening 05/23/2013  . Hyperlipidemia 03/09/2013  . Atypical nevus of back 09/12/2012  . Contact dermatitis 08/30/2012  . Allergy   . Eczema   . Lactose intolerance   . Postmenopausal HRT (hormone replacement therapy) 06/01/2012    Past Surgical History:  Procedure Laterality Date  . CARPAL TUNNEL RELEASE Right   . VAGINAL HYSTERECTOMY      OB History   No obstetric history on file.      Home Medications    Prior to Admission medications   Medication Sig  Start Date End Date Taking? Authorizing Provider  desoximetasone (TOPICORT) 0.05 % cream Apply 1 application topically 2 (two) times daily.    [provider]  diclofenac sodium (VOLTAREN) 1 % GEL Apply 4 g topically 4 (four) times daily. 05/19/17   Hyatt, Max T, DPM  estradiol (ESTRACE) 0.5 MG tablet Take 1 tablet (0.5 mg total) by mouth daily. 02/27/17   Briscoe Deutscher, DO  Multiple Vitamin (MULTIVITAMIN) tablet Take 1 tablet by mouth daily.    [provider]  Red Yeast Rice 600 MG CAPS Take 1 capsule (600 mg total) by mouth daily. 10/25/15   Dorothy Spark, MD  vitamin C (ASCORBIC ACID) 500 MG tablet Take 500 mg by mouth daily.    [provider]    Family History Family History  Problem Relation Age of Onset  . Heart disease Mother 53       MI  . Hypertension Mother   . Dementia Father   . Colon cancer Neg Hx     Social History Social History   Tobacco Use  . Smoking status: Never Smoker  . Smokeless tobacco: Never Used  Substance Use Topics  . Alcohol use: No  . Drug use: No     Allergies   Latex and Other   Review of Systems Review of Systems   Physical Exam Triage Vital Signs ED Triage Vitals [03/19/18 1114]  Enc Vitals  Group     BP 137/88     Pulse Rate 94     Resp 18     Temp 97.8 F (36.6 C)     Temp Source Temporal     SpO2 97 %     Weight      Height      Head Circumference      Peak Flow      Pain Score 0     Pain Loc      Pain Edu?      Excl. in [place]?    No data found.  Updated Vital Signs BP 137/88 (BP Location: Right Arm)   Pulse 94   Temp 97.8 F (36.6 C) (Temporal)   Resp 18   SpO2 97%    Physical Exam Vitals signs and nursing note reviewed.  Constitutional:      Appearance: She is normal weight.  HENT:     Head: Normocephalic and atraumatic.     Right Ear: Tympanic membrane and external ear normal.     Left Ear: External ear normal.     Mouth/Throat:     Mouth: Mucous membranes are moist.      Pharynx: Oropharynx is clear.  Eyes:     Extraocular Movements: Extraocular movements intact.     Conjunctiva/sclera: Conjunctivae normal.     Comments: Normal fundi  Neck:     Musculoskeletal: Normal range of motion and neck supple.     Comments: No bruit or adenopathy Cardiovascular:     Rate and Rhythm: Normal rate and regular rhythm.     Heart sounds: Normal heart sounds.  Pulmonary:     Effort: Pulmonary effort is normal.     Breath sounds: Normal breath sounds.  Abdominal:     General: Bowel sounds are normal.     Tenderness: There is no abdominal tenderness.  Musculoskeletal: Normal range of motion.  Skin:    General: Skin is warm and dry.  Neurological:     General: No focal deficit present.     Mental Status: She is alert and oriented to person, place, and time.     Cranial Nerves: No cranial nerve deficit.     Coordination: Coordination normal.     Gait: Gait normal.     Comments: Negative Romberg  Psychiatric:        Mood and Affect: Mood normal.        Behavior: Behavior normal.        Thought Content: Thought content normal.        Judgment: Judgment normal.      UC Treatments / Results  Labs (all labs ordered are listed, but only abnormal results are displayed) Labs Reviewed - No data to display  EKG None  Radiology No results found.  Procedures Procedures (including critical care time)  Medications Ordered in UC Medications - No data to display  Initial Impression / Assessment and Plan / UC Course  I have reviewed the triage vital signs and the nursing notes.  Pertinent labs & imaging results that were available during my care of the patient were reviewed by me and considered in my medical decision making (see chart for details).     Final Clinical Impressions(s) / UC Diagnoses   Final diagnoses:  Visual hallucinations     Discharge Instructions     This may be a transient secondary effect of the Tamiflu, but the persistence of the  symptoms combined with extreme fatigue and headache warrant  further investigation today in the emergency department.    ED Prescriptions    None     Controlled Substance Prescriptions [place] Controlled Substance Registry consulted? Not Applicable   Robyn Haber, MD 03/19/18 1147

## 2018-03-19 NOTE — ED Provider Notes (Signed)
[facility] EMERGENCY DEPARTMENT Provider Note   CSN: [id number] Arrival date & time: 03/19/18  1209     History   Chief Complaint Chief Complaint  Patient presents with  . Headache  . Hallucinations    HPI Beverly Carter is a 73 y.o. female.  The history is provided by the patient.  Illness  Location:  General, headache, intermittent visual hallucinations Severity:  Mild Onset quality:  Gradual Timing:  Intermittent Progression:  Waxing and waning Chronicity:  New Context:  Patient concerned about intermittent hallucinations she has been having. Thinks they started after starting tamiflu.  Relieved by:  Nothing Worsened by:  Medications possibly Associated symptoms: fever and headaches   Associated symptoms: no abdominal pain, no chest pain, no congestion, no cough, no diarrhea, no ear pain, no loss of consciousness, no myalgias, no nausea, no rash, no rhinorrhea, no shortness of breath, no sore throat, no vomiting and no wheezing     Past Medical History:  Diagnosis Date  . Allergy   . Arthritis   . Cataracts, bilateral   . Diverticulosis of colon (without mention of hemorrhage) 2005  . Eczema   . Lactose intolerance    per pt    Patient Active Problem List   Diagnosis Date Noted  . Pure hypercholesterolemia 03/06/2017  . Hormone replacement therapy (HRT) 03/06/2017  . Arthritis of carpometacarpal (CMC) joints of both thumbs 03/20/2016  . Bilateral carpal tunnel syndrome 03/20/2016  . Vulvar atrophy 08/03/2014  . DOE (dyspnea on exertion) 06/27/2014  . Chest pain 06/27/2014  . Meniscus tear 12/13/2013  . Diarrhea 05/23/2013  . Colon cancer screening 05/23/2013  . Hyperlipidemia 03/09/2013  . Atypical nevus of back 09/12/2012  . Contact dermatitis 08/30/2012  . Allergy   . Eczema   . Lactose intolerance   . Postmenopausal HRT (hormone replacement therapy) 06/01/2012    Past Surgical History:  Procedure Laterality Date  . CARPAL  TUNNEL RELEASE Right   . VAGINAL HYSTERECTOMY       OB History   No obstetric history on file.      Home Medications    Prior to Admission medications   Medication Sig Start Date End Date Taking? Authorizing Provider  desoximetasone (TOPICORT) 0.05 % cream Apply 1 application topically 2 (two) times daily.    [provider]  diclofenac sodium (VOLTAREN) 1 % GEL Apply 4 g topically 4 (four) times daily. 05/19/17   Hyatt, Max T, DPM  estradiol (ESTRACE) 0.5 MG tablet Take 1 tablet (0.5 mg total) by mouth daily. 02/27/17   Briscoe Deutscher, DO  Multiple Vitamin (MULTIVITAMIN) tablet Take 1 tablet by mouth daily.    [provider]  Red Yeast Rice 600 MG CAPS Take 1 capsule (600 mg total) by mouth daily. 10/25/15   Dorothy Spark, MD  vitamin C (ASCORBIC ACID) 500 MG tablet Take 500 mg by mouth daily.    [provider]    Family History Family History  Problem Relation Age of Onset  . Heart disease Mother 20       MI  . Hypertension Mother   . Dementia Father   . Colon cancer Neg Hx     Social History Social History   Tobacco Use  . Smoking status: Never Smoker  . Smokeless tobacco: Never Used  Substance Use Topics  . Alcohol use: No  . Drug use: No     Allergies   Latex and Other   Review of Systems Review  of Systems  Constitutional: Positive for fever. Negative for chills.  HENT: Negative for congestion, ear pain, rhinorrhea and sore throat.   Eyes: Negative for pain and visual disturbance.  Respiratory: Negative for cough, shortness of breath and wheezing.   Cardiovascular: Negative for chest pain and palpitations.  Gastrointestinal: Negative for abdominal pain, diarrhea, nausea and vomiting.  Genitourinary: Negative for dysuria and hematuria.  Musculoskeletal: Negative for arthralgias, back pain and myalgias.  Skin: Negative for color change and rash.  Neurological: Positive for headaches. Negative for seizures, loss of  consciousness and syncope.  Psychiatric/Behavioral: Positive for hallucinations and sleep disturbance.  All other systems reviewed and are negative.    Physical Exam Updated Vital Signs  ED Triage Vitals  Enc Vitals Group     BP 03/19/18 1214 (!) 175/91     Pulse Rate 03/19/18 1214 96     Resp 03/19/18 1214 18     Temp 03/19/18 1214 98.2 F (36.8 C)     Temp Source 03/19/18 1214 Oral     SpO2 03/19/18 1214 96 %     Weight --      Height --      Head Circumference --      Peak Flow --      Pain Score 03/19/18 1217 5     Pain Loc --      Pain Edu? --      Excl. in [place]? --     Physical Exam Vitals signs and nursing note reviewed.  Constitutional:      General: She is not in acute distress.    Appearance: She is well-developed.  HENT:     Head: Normocephalic and atraumatic.     Mouth/Throat:     Mouth: Mucous membranes are moist.     Pharynx: Oropharynx is clear.  Eyes:     General: No visual field deficit.    Extraocular Movements: Extraocular movements intact.     Right eye: Normal extraocular motion and no nystagmus.     Left eye: Normal extraocular motion and no nystagmus.     Conjunctiva/sclera: Conjunctivae normal.     Pupils: Pupils are equal, round, and reactive to light.     Right eye: Pupil is round and reactive.     Left eye: Pupil is round and reactive.  Neck:     Musculoskeletal: Normal range of motion and neck supple.  Cardiovascular:     Rate and Rhythm: Normal rate and regular rhythm.     Heart sounds: Normal heart sounds. No murmur.  Pulmonary:     Effort: Pulmonary effort is normal. No respiratory distress.     Breath sounds: Normal breath sounds.  Abdominal:     Palpations: Abdomen is soft.     Tenderness: There is no abdominal tenderness.  Musculoskeletal: Normal range of motion.  Skin:    General: Skin is warm and dry.     Capillary Refill: Capillary refill takes less than 2 seconds.  Neurological:     Mental Status: She is alert and  oriented to person, place, and time.     Cranial Nerves: No cranial nerve deficit, dysarthria or facial asymmetry.     Sensory: No sensory deficit.     Motor: No weakness.     Coordination: Romberg sign negative. Coordination normal.     Gait: Gait normal.  Psychiatric:        Mood and Affect: Mood normal.      ED Treatments / Results  Labs (all  labs ordered are listed, but only abnormal results are displayed) Labs Reviewed  COMPREHENSIVE METABOLIC PANEL - Abnormal; Notable for the following components:      Result Value   Glucose, Bld 103 (*)    All other components within normal limits  CBC    EKG None  Radiology Ct Head Wo Contrast  Result Date: 03/19/2018 CLINICAL DATA:  Altered mental status since taking Tamiflu 2-1/2 weeks ago. EXAM: CT HEAD WITHOUT CONTRAST TECHNIQUE: Contiguous axial images were obtained from the base of the skull through the vertex without intravenous contrast. COMPARISON:  None. FINDINGS: Brain: No evidence of acute infarction, hemorrhage, hydrocephalus, extra-axial collection or mass lesion/mass effect. Vascular: No hyperdense vessel or unexpected calcification. Skull: Intact.  No focal lesion. Sinuses/Orbits: Negative. Other: None. IMPRESSION: Normal head CT. Electronically Signed   By: Inge Rise M.D.   On: 03/19/2018 16:16    Procedures Procedures (including critical care time)  Medications Ordered in ED Medications  sodium chloride flush (NS) 0.9 % injection 3 mL (has no administration in time range)     Initial Impression / Assessment and Plan / ED Course  I have reviewed the triage vital signs and the nursing notes.  Pertinent labs & imaging results that were available during my care of the patient were reviewed by me and considered in my medical decision making (see chart for details).     Beverly Carter is a 73 year old female with no significant medical history who presents to the ED with intermittent headaches, mental  status change, hallucinations over the last several weeks.  With normal vitals.  No fever.  Overall is well-appearing.  Was sent from urgent care for further evaluation.  Patient states that symptoms started after she started Tamiflu 2 weeks ago.  States that she has some visual hallucinations when she first wakes up in the morning on most days but denies any other symptoms throughout the day.  Denies any loss of balance, double vision, trouble swallowing.  She is neurologically intact on exam.  No signs to suggest meningitis.  She has mostly had some type of viral syndrome on and off during this time as well.  She has been given multiple medications including multiple shots of steroids and antibiotics.  However, patient states that has been days since she has had a fever.  She denies any depression, suicidal ideation, homicidal ideation.  Overall patient is well-appearing, normal exam, lab work that was drawn show no significant anemia, electrolyte abnormality, kidney injury.  CT of the head was performed that showed no acute findings.  Symptoms possibly could be secondary to Tamiflu, steroid use.  Possibly could be dementia or other psych related process.  Patient does live by herself but she does not endorse any depression.  She has had decreased appetite, has been sleeping more than usual.  Recommend that she follow-up closely with her primary care doctor to discuss further work-up.  Recommend possible dementia work-up versus psychiatric work-up.  Would recommend an MRI to further rule out organic process as well. Given return precautions.   This chart was dictated using voice recognition software.  Despite best efforts to proofread,  errors can occur which can change the documentation meaning.   Final Clinical Impressions(s) / ED Diagnoses   Final diagnoses:  Hallucinations  Migraine without aura and without status migrainosus, not intractable    ED Discharge Orders    None       Lennice Sites, DO 03/19/18 1642

## 2018-03-19 NOTE — ED Notes (Signed)
Patient verbalizes understanding of discharge instructions. Opportunity for questioning and answers were provided. Pt discharged from ED. 

## 2018-03-19 NOTE — ED Triage Notes (Signed)
Pt presents for ongoing visual hallucinations, headache and generalized fatigue x 2-3 weeks. Reports had flu like illness around 1/25, seen at PCP and [facility] for same, given multiple medications. Pt reports she sees people staring at her in the mornings, does not have hallucinations at any other time of day. Pt is ambulatory, AxO x4 otherwise.

## 2018-03-19 NOTE — ED Triage Notes (Addendum)
Pt sts visual hallucinations x 2 weeks; pt seen here last week for fever and HA; pt feels like could be from medication

## 2018-03-26 ENCOUNTER — Ambulatory Visit (INDEPENDENT_AMBULATORY_CARE_PROVIDER_SITE_OTHER): Payer: Medicare Other

## 2018-03-26 ENCOUNTER — Encounter (HOSPITAL_COMMUNITY): Payer: Self-pay | Admitting: Emergency Medicine

## 2018-03-26 ENCOUNTER — Ambulatory Visit (HOSPITAL_COMMUNITY)
Admission: EM | Admit: 2018-03-26 | Discharge: 2018-03-26 | Disposition: A | Discharge status: Home / Self Care | Payer: Medicare Other | Attending: Internal Medicine | Admitting: Internal Medicine

## 2018-03-26 DIAGNOSIS — R443 Hallucinations, unspecified: Secondary | ICD-10-CM | POA: Insufficient documentation

## 2018-03-26 DIAGNOSIS — R5383 Other fatigue: Secondary | ICD-10-CM | POA: Diagnosis not present

## 2018-03-26 DIAGNOSIS — R0602 Shortness of breath: Secondary | ICD-10-CM | POA: Diagnosis not present

## 2018-03-26 DIAGNOSIS — R079 Chest pain, unspecified: Secondary | ICD-10-CM | POA: Diagnosis not present

## 2018-03-26 DIAGNOSIS — N390 Urinary tract infection, site not specified: Secondary | ICD-10-CM | POA: Diagnosis not present

## 2018-03-26 LAB — TSH: TSH: 0.69 u[IU]/mL (ref 0.350–4.500)

## 2018-03-26 MED ORDER — AMOXICILLIN 500 MG PO CAPS: 500.0000 mg | ORAL_CAPSULE | Freq: Three times a day (TID) | ORAL | 0 refills | Status: DC

## 2018-03-26 NOTE — ED Provider Notes (Signed)
[name]    CSN: [id number] Arrival date & time: 03/26/18  1055     History   Chief Complaint Chief Complaint  Patient presents with  . Fever    HPI Beverly Carter is a 73 y.o. female who currently completed a course of antibiotics and Tamiflu comes to the urgent care with complaints of right-sided chest pain which occurs only when she lays down.  She also complains of feeling of not taking a deep enough breath.  Pain on the right side is transient, sharp and has 5 out of 10 in severity.  Aggravated by laying down.  No known relieving factors.  Patient has a cough which is nonproductive.  She has subjective fever.  No chills.  Patient also has complaints of hallucination which only occurs in the first few moments when she wakes up from bed in the morning.  She denies any hallucinations throughout the rest of the day or when she is going to bed.  Patient has been evaluated at several facilities for different complaints.   HPI  Past Medical History:  Diagnosis Date  . Allergy   . Arthritis   . Cataracts, bilateral   . Diverticulosis of colon (without mention of hemorrhage) 2005  . Eczema   . Lactose intolerance    per pt    Patient Active Problem List   Diagnosis Date Noted  . Pure hypercholesterolemia 03/06/2017  . Hormone replacement therapy (HRT) 03/06/2017  . Arthritis of carpometacarpal (CMC) joints of both thumbs 03/20/2016  . Bilateral carpal tunnel syndrome 03/20/2016  . Vulvar atrophy 08/03/2014  . DOE (dyspnea on exertion) 06/27/2014  . Chest pain 06/27/2014  . Meniscus tear 12/13/2013  . Diarrhea 05/23/2013  . Colon cancer screening 05/23/2013  . Hyperlipidemia 03/09/2013  . Atypical nevus of back 09/12/2012  . Contact dermatitis 08/30/2012  . Allergy   . Eczema   . Lactose intolerance   . Postmenopausal HRT (hormone replacement therapy) 06/01/2012    Past Surgical History:  Procedure Laterality Date  . CARPAL TUNNEL RELEASE Right   .  VAGINAL HYSTERECTOMY      OB History   No obstetric history on file.      Home Medications    Prior to Admission medications   Medication Sig Start Date End Date Taking? Authorizing Provider  desoximetasone (TOPICORT) 0.05 % cream Apply 1 application topically 2 (two) times daily.    [provider]  diclofenac sodium (VOLTAREN) 1 % GEL Apply 4 g topically 4 (four) times daily. 05/19/17   Hyatt, Max T, DPM  estradiol (ESTRACE) 0.5 MG tablet Take 1 tablet (0.5 mg total) by mouth daily. 02/27/17   Briscoe Deutscher, DO  Multiple Vitamin (MULTIVITAMIN) tablet Take 1 tablet by mouth daily.    [provider]  Red Yeast Rice 600 MG CAPS Take 1 capsule (600 mg total) by mouth daily. 10/25/15   Dorothy Spark, MD  vitamin C (ASCORBIC ACID) 500 MG tablet Take 500 mg by mouth daily.    [provider]    Family History Family History  Problem Relation Age of Onset  . Heart disease Mother 23       MI  . Hypertension Mother   . Dementia Father   . Colon cancer Neg Hx     Social History Social History   Tobacco Use  . Smoking status: Never Smoker  . Smokeless tobacco: Never Used  Substance Use Topics  . Alcohol use: No  . Drug use:  No     Allergies   Latex and Other   Review of Systems Review of Systems  Constitutional: Positive for fatigue and fever. Negative for activity change and chills.  HENT: Negative for ear discharge, ear pain, mouth sores, sinus pressure, sinus pain, sore throat and voice change.   Respiratory: Positive for chest tightness. Negative for cough and wheezing.   Cardiovascular: Negative for chest pain and palpitations.  Genitourinary: Negative for difficulty urinating, dysuria and urgency.  Musculoskeletal: Negative for arthralgias, gait problem and myalgias.  Neurological: Negative for dizziness, weakness, numbness and headaches.  Psychiatric/Behavioral: Positive for hallucinations. Negative for agitation, confusion,  decreased concentration and suicidal ideas. The patient is nervous/anxious and is hyperactive.      Physical Exam Triage Vital Signs ED Triage Vitals [03/26/18 1109]  Enc Vitals Group     BP (!) 177/78     Pulse Rate (!) 109     Resp 18     Temp 98.6 F (37 C)     Temp Source Temporal     SpO2 97 %     Weight      Height      Head Circumference      Peak Flow      Pain Score 0     Pain Loc      Pain Edu?      Excl. in [place]?    No data found.  Updated Vital Signs BP (!) 177/78 (BP Location: Right Arm)   Pulse (!) 109   Temp 98.6 F (37 C) (Temporal)   Resp 18   SpO2 97%   Visual Acuity Right Eye Distance:   Left Eye Distance:   Bilateral Distance:    Right Eye Near:   Left Eye Near:    Bilateral Near:     Physical Exam Constitutional:      General: She is not in acute distress.    Appearance: Normal appearance. She is not ill-appearing.  HENT:     Right Ear: Tympanic membrane normal.     Left Ear: Tympanic membrane normal.     Nose: Nose normal. No rhinorrhea.  Neck:     Musculoskeletal: Normal range of motion and neck supple. No neck rigidity.  Cardiovascular:     Rate and Rhythm: Normal rate and regular rhythm.     Pulses: Normal pulses.     Heart sounds: Normal heart sounds.  Pulmonary:     Effort: Pulmonary effort is normal.     Breath sounds: Normal breath sounds. No wheezing or rales.  Chest:     Chest wall: No tenderness.  Abdominal:     General: Abdomen is flat. Bowel sounds are normal.     Palpations: Abdomen is soft.  Musculoskeletal: Normal range of motion.  Lymphadenopathy:     Cervical: No cervical adenopathy.  Skin:    General: Skin is warm.     Capillary Refill: Capillary refill takes less than 2 seconds.     Findings: No rash.  Neurological:     General: No focal deficit present.     Mental Status: She is alert and oriented to person, place, and time.  Psychiatric:        Mood and Affect: Mood normal.      UC Treatments /  Results  Labs (all labs ordered are listed, but only abnormal results are displayed) Labs Reviewed  TSH    EKG None  Radiology Dg Chest 2 View  Result Date: 03/26/2018 CLINICAL DATA:  Right lower chest pain and shortness of breath for 4 days. EXAM: CHEST - 2 VIEW COMPARISON:  PA and lateral chest 02/04/2017. FINDINGS: Small right pleural effusion and basilar airspace disease are identified. Left lung is clear. No left effusion. No pneumothorax. Heart size is normal. Aortic atherosclerosis noted. No acute or focal bony abnormality. IMPRESSION: Small right pleural effusion and basilar airspace disease which could be atelectasis or pneumonia. Atherosclerosis. Electronically Signed   By: Inge Rise M.D.   On: 03/26/2018 13:37    Procedures Procedures (including critical care time)  Medications Ordered in UC Medications - No data to display  Initial Impression / Assessment and Plan / UC Course  I have reviewed the triage vital signs and the nursing notes.  Pertinent labs & imaging results that were available during my care of the patient were reviewed by me and considered in my medical decision making (see chart for details).     1.  Shortness of breath likely secondary to atelectasis: Chest x-ray is remarkable for atelectasis versus pneumonia Patient has completed a course of Augmentin recently She does not have any significant fever I do not think the patient needs antibiotics. Patient is encouraged to walk and be taking deep breaths. Patient is not hypoxic or hypotensive. Wells score is low for this patient  2.  Transient hallucinations: Psychiatry referral TSH is normal Final Clinical Impressions(s) / UC Diagnoses   Final diagnoses:  Lower urinary tract infectious disease  Hallucinations, unspecified   Discharge Instructions   None    ED Prescriptions    Medication Sig Dispense Auth. Provider   amoxicillin (AMOXIL) 500 MG capsule  (Status: Discontinued) Take  1 capsule (500 mg total) by mouth 3 (three) times daily for 5 days. 21 capsule Takeshi Teasdale, Myrene Galas, MD     Controlled Substance Prescriptions [place] Controlled Substance Registry consulted? No   Chase Picket, MD 03/26/18 [phone]

## 2018-03-26 NOTE — ED Triage Notes (Signed)
Pt here for fever x 2 weeks and visual hallucinations; pt seen here multiple times for same and pt was seen in ED last visit

## 2018-07-07 ENCOUNTER — Other Ambulatory Visit: Payer: Self-pay | Admitting: Family Medicine

## 2018-07-07 DIAGNOSIS — Z79890 Hormone replacement therapy (postmenopausal): Secondary | ICD-10-CM

## 2018-07-09 NOTE — Telephone Encounter (Signed)
No refill. Please see A/P of the last visit. I told her that I will not prescribe going forward.

## 2018-07-09 NOTE — Telephone Encounter (Signed)
Pt is aware of below message however does not want to make an appt now. Pt does want 30 day supply

## 2018-07-09 NOTE — Telephone Encounter (Signed)
See request °

## 2018-07-09 NOTE — Telephone Encounter (Signed)
We have not seen in a year ok to give 30 and have her make app?

## 2018-07-12 ENCOUNTER — Ambulatory Visit
Admission: RE | Admit: 2018-07-12 | Discharge: 2018-07-12 | Disposition: A | Discharge status: Home / Self Care | Payer: Medicare Other | Source: Ambulatory Visit | Attending: Family Medicine | Admitting: Family Medicine

## 2018-07-12 ENCOUNTER — Other Ambulatory Visit: Payer: Self-pay

## 2018-07-12 ENCOUNTER — Other Ambulatory Visit: Payer: Self-pay | Admitting: Family Medicine

## 2018-07-12 DIAGNOSIS — R059 Cough, unspecified: Secondary | ICD-10-CM

## 2018-07-12 DIAGNOSIS — R05 Cough: Secondary | ICD-10-CM

## 2019-04-05 ENCOUNTER — Other Ambulatory Visit: Payer: Self-pay | Admitting: Family Medicine

## 2019-04-05 DIAGNOSIS — Z1231 Encounter for screening mammogram for malignant neoplasm of breast: Secondary | ICD-10-CM

## 2019-04-10 ENCOUNTER — Ambulatory Visit: Payer: Medicare Other

## 2019-05-04 ENCOUNTER — Ambulatory Visit: Payer: Medicare Other

## 2019-08-24 ENCOUNTER — Other Ambulatory Visit: Payer: Self-pay

## 2019-08-24 ENCOUNTER — Ambulatory Visit
Admission: RE | Admit: 2019-08-24 | Discharge: 2019-08-24 | Disposition: A | Discharge status: Home / Self Care | Payer: Medicare Other | Source: Ambulatory Visit | Attending: Family Medicine | Admitting: Family Medicine

## 2019-08-24 DIAGNOSIS — Z1231 Encounter for screening mammogram for malignant neoplasm of breast: Secondary | ICD-10-CM

## 2019-08-29 ENCOUNTER — Other Ambulatory Visit: Payer: Self-pay | Admitting: Family Medicine

## 2019-08-29 DIAGNOSIS — R928 Other abnormal and inconclusive findings on diagnostic imaging of breast: Secondary | ICD-10-CM

## 2019-10-13 ENCOUNTER — Telehealth: Payer: Self-pay

## 2019-10-13 ENCOUNTER — Ambulatory Visit
Admission: RE | Admit: 2019-10-13 | Discharge: 2019-10-13 | Disposition: A | Discharge status: Home / Self Care | Payer: Medicare Other | Source: Ambulatory Visit | Attending: Family Medicine | Admitting: Family Medicine

## 2019-10-13 ENCOUNTER — Ambulatory Visit: Payer: Medicare Other

## 2019-10-13 ENCOUNTER — Other Ambulatory Visit: Payer: Self-pay

## 2019-10-13 DIAGNOSIS — R928 Other abnormal and inconclusive findings on diagnostic imaging of breast: Secondary | ICD-10-CM

## 2019-11-14 IMAGING — CT CT HEAD W/O CM
4 series · 17 of 47 positions shown, 19 images · non-contrast
Comparison: None.

CLINICAL DATA: Altered mental status since taking Tamiflu 2-1/2
weeks ago.

EXAM:
CT HEAD WITHOUT CONTRAST
TECHNIQUE: Contiguous axial images were obtained from the base of the skull
through the vertex without intravenous contrast.

[Series 3: head without · axial · non-contrast · 0.44mm/px · z∈[-116,+4]mm · 7 of 34 slices shown, 9 images]
[im 5/34  brain]
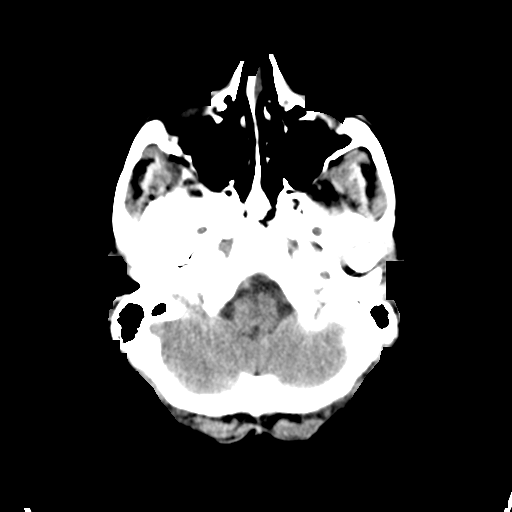
[im 5/34  bone]
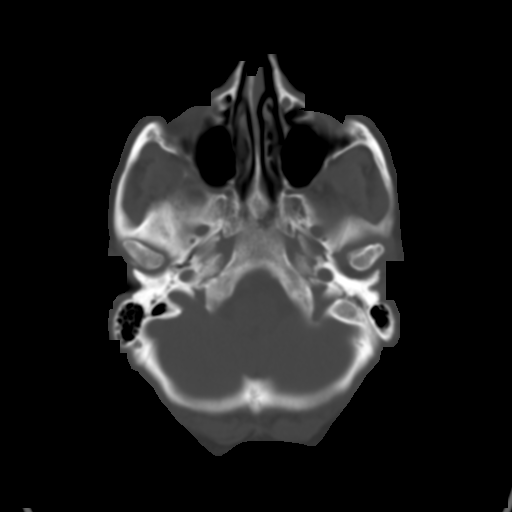
[im 9/34  brain]
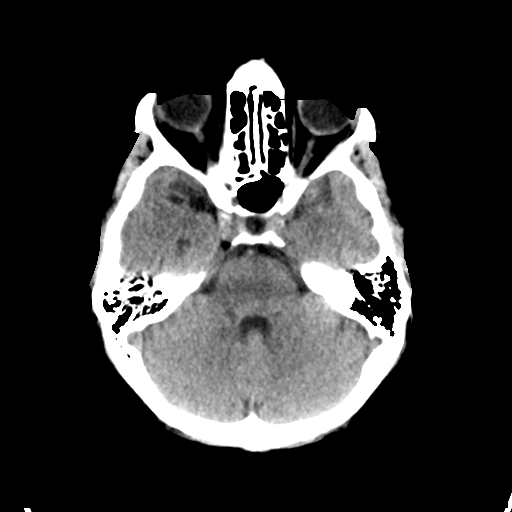
[im 13/34  brain]
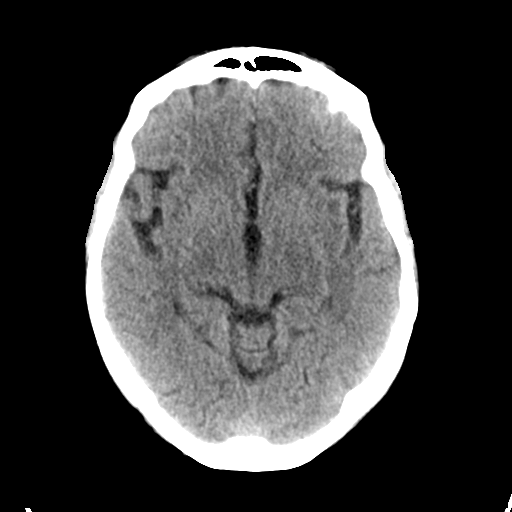
[im 17/34  brain]
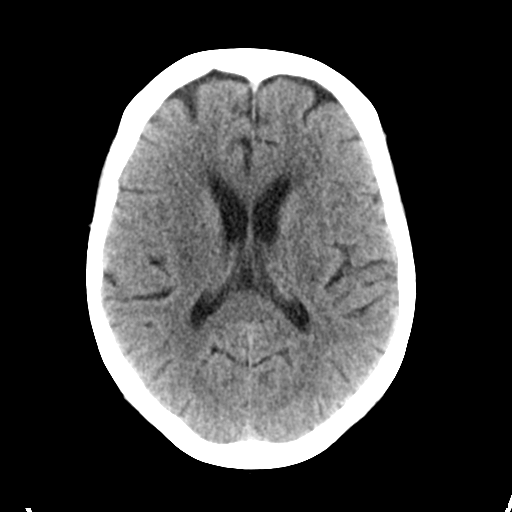
[im 21/34  brain]
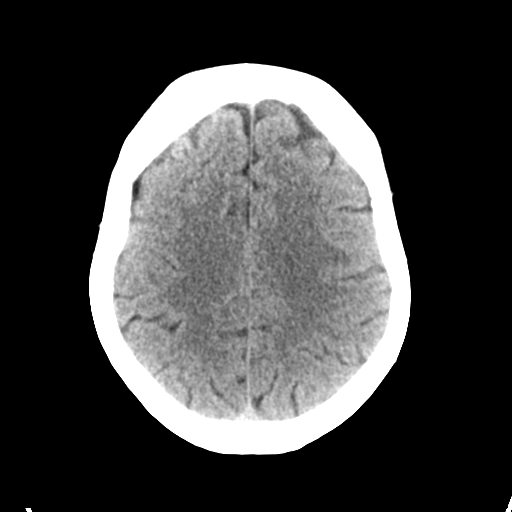
[im 21/34  bone]
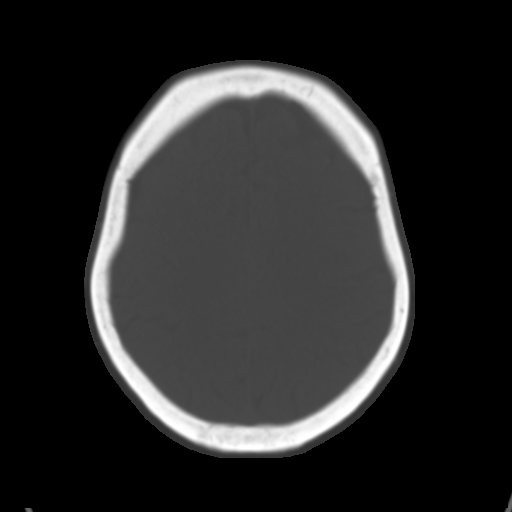
[im 25/34  brain]
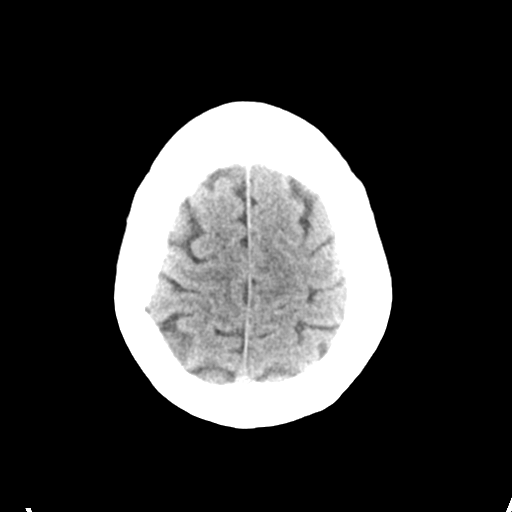
[im 29/34  brain]
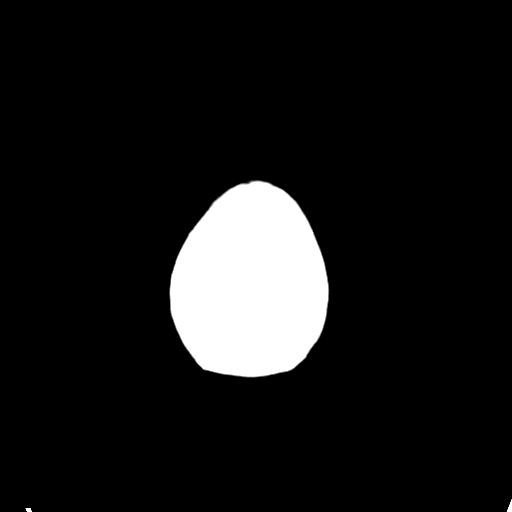

[Series 4: head bone · axial · 0.44mm/px · z∈[-120,-62]mm · 4 of 85 slices shown]
[im 9/85  bone]
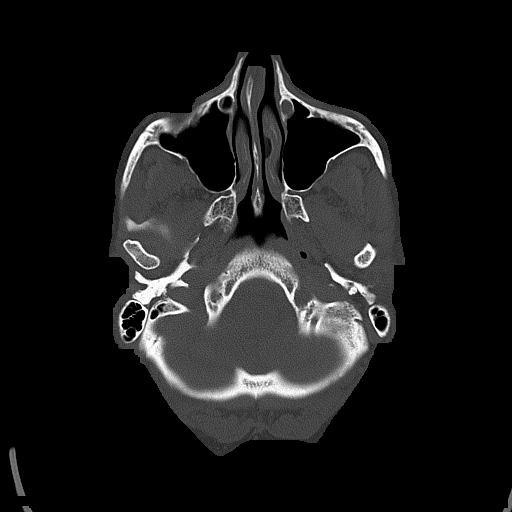
[im 17/85  bone]
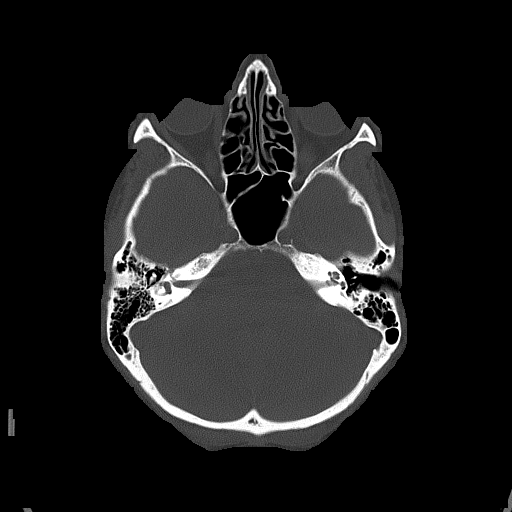
[im 26/85  bone]
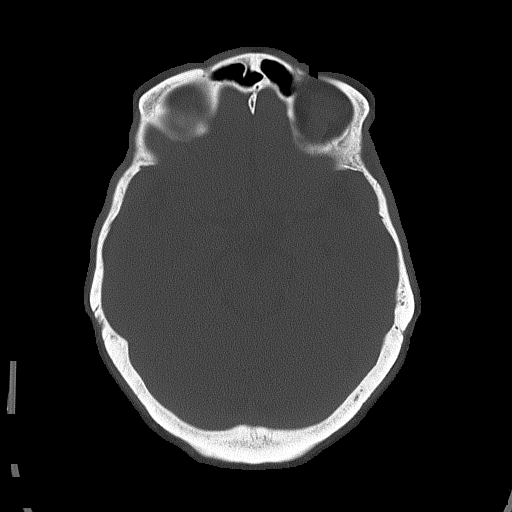
[im 38/85  bone]
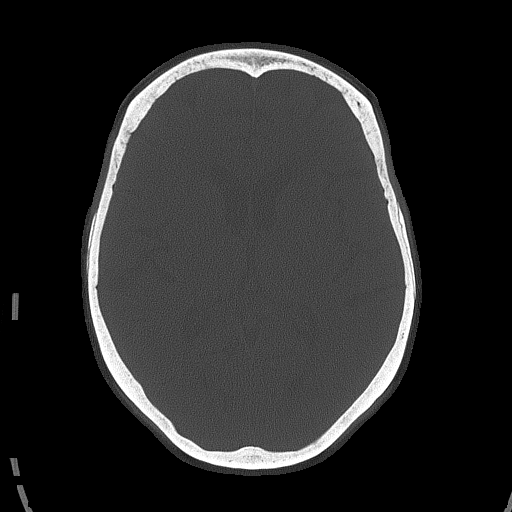

[Series 5: head without cor · coronal · non-contrast · 0.33mm/px · 3 of 67 slices shown]
[im 23/67  brain]
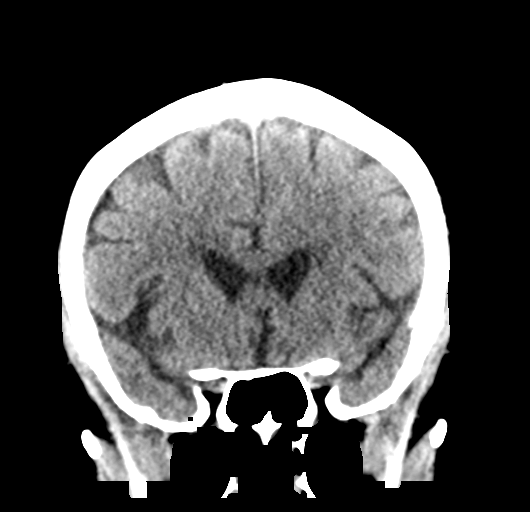
[im 30/67  brain]
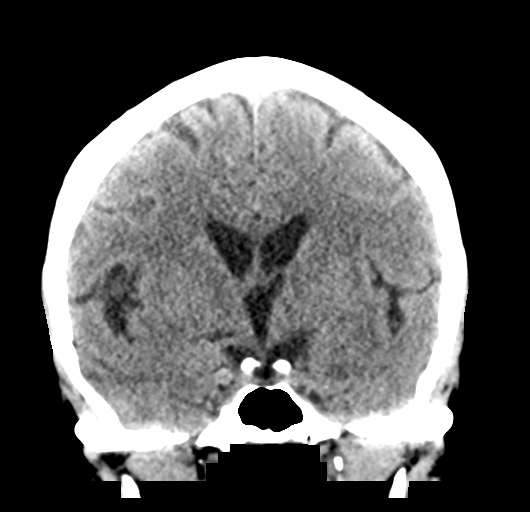
[im 37/67  brain]
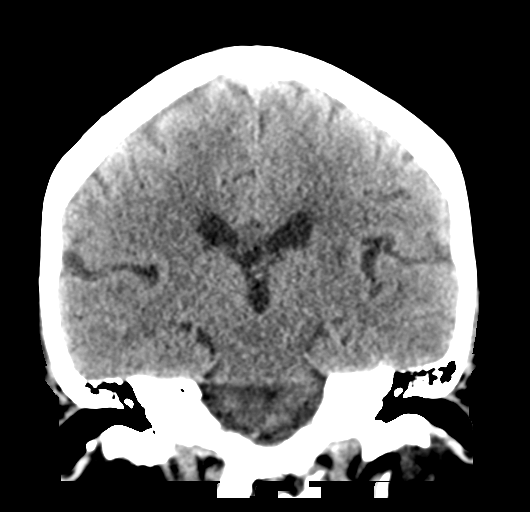

[Series 6: head without sag · sagittal · non-contrast · 0.33mm/px · 3 of 61 slices shown]
[im 21/61  brain]
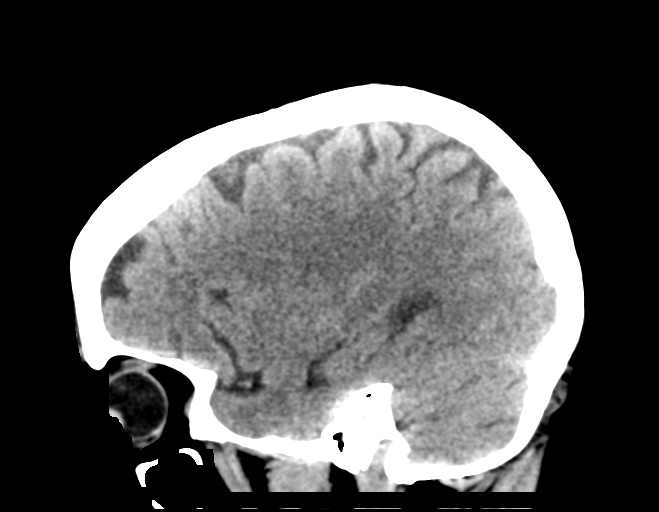
[im 31/61  brain]
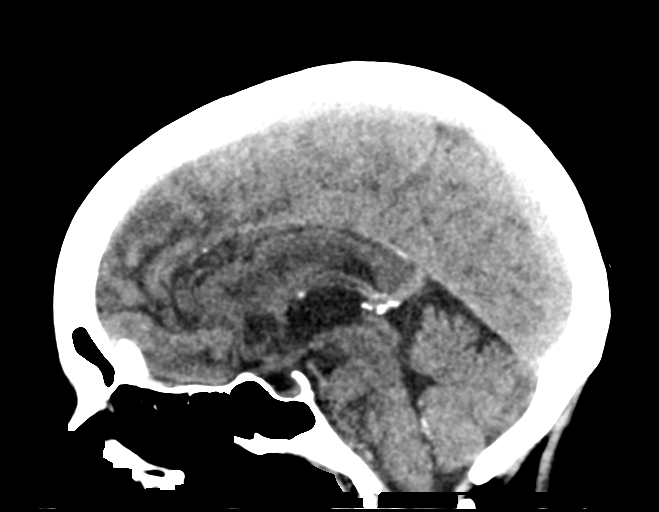
[im 41/61  brain]
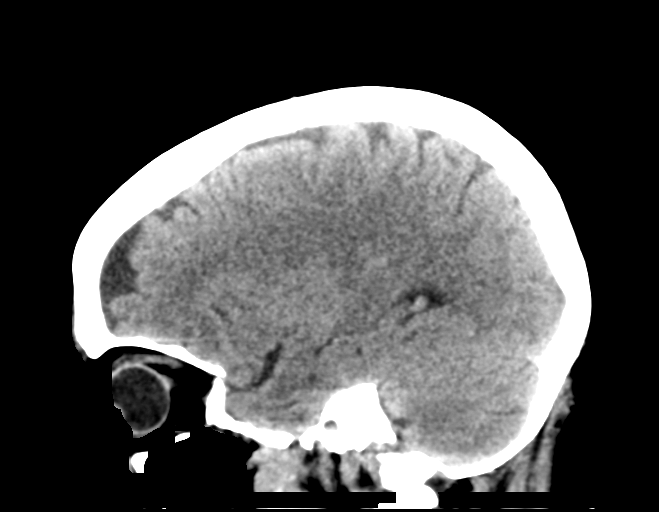

[17 of 47 positions shown; findings below may reference images not displayed]

FINDINGS: Brain: No evidence of acute infarction, hemorrhage, hydrocephalus,
extra-axial collection or mass lesion/mass effect.

Vascular: No hyperdense vessel or unexpected calcification.

Skull: Intact.  No focal lesion.

Sinuses/Orbits: Negative.

Other: None.
IMPRESSION: Normal head CT.

## 2019-11-21 IMAGING — DX DG CHEST 2V
2 series · 2 of 2 positions shown · non-contrast
Comparison: PA and lateral chest 02/04/2017.

CLINICAL DATA: Right lower chest pain and shortness of breath for 4
days.

EXAM:
CHEST - 2 VIEW

[chest pa]
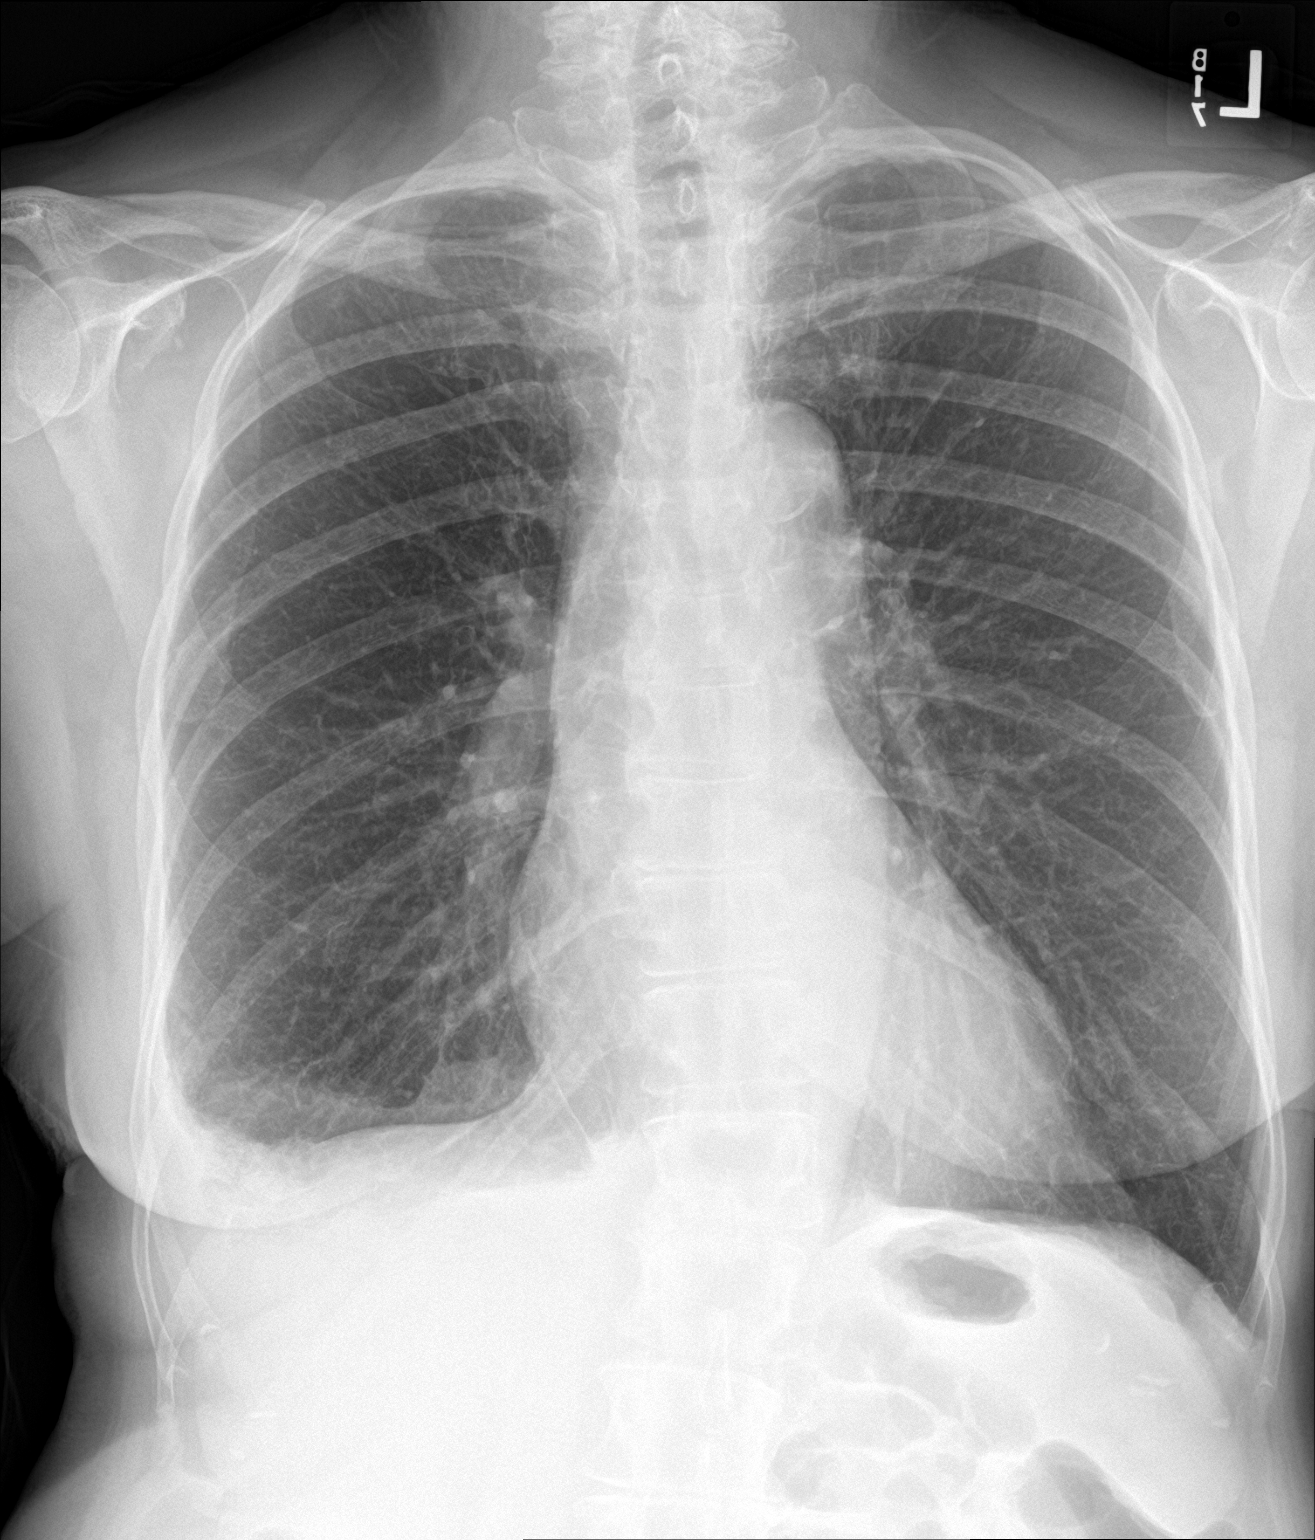

[chest lat]
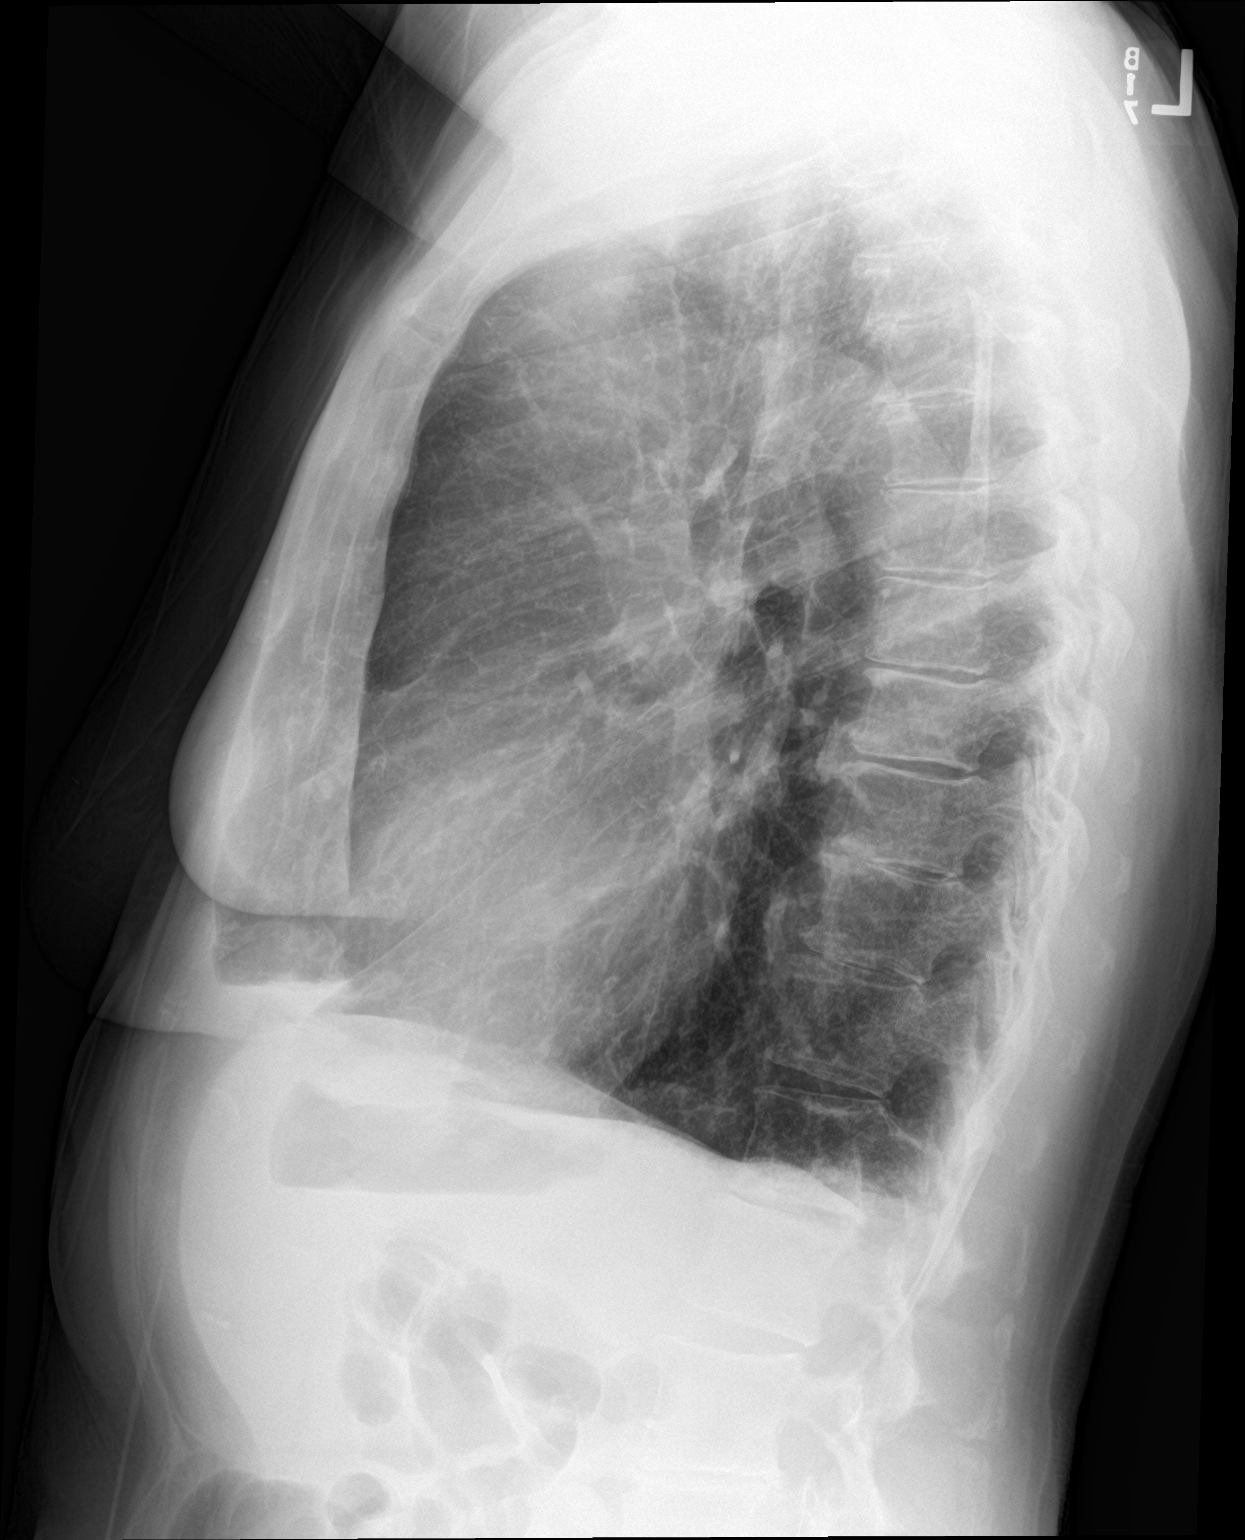

[2 of 2 positions shown; findings below may reference images not displayed]

FINDINGS: Small right pleural effusion and basilar airspace disease are
identified. Left lung is clear. No left effusion. No pneumothorax.
Heart size is normal. Aortic atherosclerosis noted. No acute or
focal bony abnormality.
IMPRESSION: Small right pleural effusion and basilar airspace disease which
could be atelectasis or pneumonia.

Atherosclerosis.

## 2020-01-02 DIAGNOSIS — M25512 Pain in left shoulder: Secondary | ICD-10-CM | POA: Insufficient documentation

## 2020-01-31 ENCOUNTER — Other Ambulatory Visit: Payer: Self-pay | Admitting: Family Medicine

## 2020-01-31 ENCOUNTER — Ambulatory Visit
Admission: RE | Admit: 2020-01-31 | Discharge: 2020-01-31 | Disposition: A | Discharge status: Home / Self Care | Payer: Medicare Other | Source: Ambulatory Visit | Attending: Family Medicine | Admitting: Family Medicine

## 2020-01-31 DIAGNOSIS — M2062 Acquired deformities of toe(s), unspecified, left foot: Secondary | ICD-10-CM

## 2020-01-31 DIAGNOSIS — S99922A Unspecified injury of left foot, initial encounter: Secondary | ICD-10-CM

## 2020-05-22 DIAGNOSIS — M1711 Unilateral primary osteoarthritis, right knee: Secondary | ICD-10-CM | POA: Insufficient documentation

## 2020-05-22 DIAGNOSIS — M1712 Unilateral primary osteoarthritis, left knee: Secondary | ICD-10-CM | POA: Insufficient documentation

## 2020-05-22 DIAGNOSIS — M25561 Pain in right knee: Secondary | ICD-10-CM | POA: Insufficient documentation

## 2021-01-09 ENCOUNTER — Other Ambulatory Visit: Payer: Self-pay | Admitting: Family Medicine

## 2021-01-09 DIAGNOSIS — R1011 Right upper quadrant pain: Secondary | ICD-10-CM

## 2021-02-07 ENCOUNTER — Ambulatory Visit
Admission: RE | Admit: 2021-02-07 | Discharge: 2021-02-07 | Disposition: A | Discharge status: Home / Self Care | Payer: Medicare (Managed Care) | Source: Ambulatory Visit | Attending: Family Medicine | Admitting: Family Medicine

## 2021-02-07 DIAGNOSIS — R1011 Right upper quadrant pain: Secondary | ICD-10-CM

## 2021-03-18 DIAGNOSIS — K909 Intestinal malabsorption, unspecified: Secondary | ICD-10-CM | POA: Insufficient documentation

## 2021-03-18 DIAGNOSIS — R143 Flatulence: Secondary | ICD-10-CM | POA: Insufficient documentation

## 2021-08-08 ENCOUNTER — Telehealth: Payer: Self-pay | Admitting: Internal Medicine

## 2021-08-08 NOTE — Telephone Encounter (Signed)
Would defer this decision to Dr. Silverio Decamp

## 2021-08-08 NOTE — Telephone Encounter (Signed)
Dr. Tonye Royalty,  Referral from PCP.  Patient was last seen in our office in 2015.  She went to [place] earlier this year due to insurance.  She is now requesting to transfer back to [facility] and requesting to see Dr. Silverio Decamp because she was not pleased with the care she received. (Records are in Epic).  Please advise scheduling and transferring to Dr. Silverio Decamp.  Thanks!

## 2021-08-08 NOTE — Telephone Encounter (Signed)
Dr. Silverio Decamp,  Will you please see previous note and advise scheduling?  Thanks!

## 2021-08-14 NOTE — Telephone Encounter (Signed)
Ok, schedule next available appointment with me and if I do not have available appointments sooner, we can bring him in for an appointment with APP. She needs colonoscopy for further evaluation of chronic diarrhea, she was recommended by gastroenterologist at [facility] to undergo colonoscopy but patient had canceled the appointment.

## 2021-09-04 ENCOUNTER — Ambulatory Visit (INDEPENDENT_AMBULATORY_CARE_PROVIDER_SITE_OTHER): Payer: Medicare (Managed Care) | Admitting: Gastroenterology

## 2021-09-04 ENCOUNTER — Encounter: Payer: Self-pay | Admitting: Gastroenterology

## 2021-09-04 ENCOUNTER — Other Ambulatory Visit: Payer: Medicare (Managed Care)

## 2021-09-04 VITALS — BP 148/76 | HR 76 | Ht 64.0 in | Wt 153.0 lb

## 2021-09-04 DIAGNOSIS — E739 Lactose intolerance, unspecified: Secondary | ICD-10-CM | POA: Diagnosis not present

## 2021-09-04 DIAGNOSIS — K529 Noninfective gastroenteritis and colitis, unspecified: Secondary | ICD-10-CM | POA: Diagnosis not present

## 2021-09-04 DIAGNOSIS — R159 Full incontinence of feces: Secondary | ICD-10-CM

## 2021-09-04 DIAGNOSIS — R152 Fecal urgency: Secondary | ICD-10-CM | POA: Diagnosis not present

## 2021-09-04 NOTE — Progress Notes (Signed)
Beverly Carter    [id number]    May 30, 1945  Primary Care Physician:Richter, Maebelle Munroe, MD  Referring Physician: Hayden Rasmussen, MD [address] [place],  [place] [address]   Chief complaint: Chronic diarrhea    HPI:  76 yr old very pleasant female for second opinion with complaints of chronic diarrhea.  She was previously followed by Dr. Hilarie Fredrickson and Dr. Jerene Pitch at [facility]. She has had loose to semiformed bowel movements on average once a day, she does not have a bowel movement for a day or so sometimes. Denies any changes to her diet or any new medications  She has intermittent abdominal cramping, denies any mucus or blood in stool.  She was recommended repeat colonoscopy at [facility], but patient has not scheduled it  GI work-up: Colonoscopy: 06/21/2013 by Dr.Pyrtle 1. There was severe diverticulosis noted in the descending colon and sigmoid colon 2. The colon mucosa was otherwise normal  EGD: None recent Imaging: None recent  Dextrose blood test March 26, 2021 Increase in combined hydrogen and methane production to 18 ppm over  baseline at 90 minutes. Although this production pattern does not meet the  criteria for small intestinal bacterial overgrowth of a >20 ppm increase  in gas production over baseline at or before 90 minutes, it suggest the  possibility of increased foregut bacterial colonization.    Outpatient Encounter Medications as of 09/04/2021  Medication Sig   desoximetasone (TOPICORT) 0.05 % cream Apply 1 application topically 2 (two) times daily.   diclofenac sodium (VOLTAREN) 1 % GEL Apply 4 g topically 4 (four) times daily.   estradiol (ESTRACE) 0.5 MG tablet Take 1 tablet (0.5 mg total) by mouth daily.   Multiple Vitamin (MULTIVITAMIN) tablet Take 1 tablet by mouth daily.   Red Yeast Rice 600 MG CAPS Take 1 capsule (600 mg total) by mouth daily.   vitamin C (ASCORBIC ACID) 500 MG tablet Take 500  mg by mouth daily.   No facility-administered encounter medications on file as of 09/04/2021.    Allergies as of 09/04/2021 - Review Complete 03/26/2018  Allergen Reaction Noted   Latex  06/16/2011   Other  09/14/2014    Past Medical History:  Diagnosis Date   Allergy    Arthritis    Cataracts, bilateral    Diverticulosis of colon (without mention of hemorrhage) 2005   Eczema    Lactose intolerance    per pt    Past Surgical History:  Procedure Laterality Date   CARPAL TUNNEL RELEASE Right    VAGINAL HYSTERECTOMY      Family History  Problem Relation Age of Onset   Heart disease Mother 56       MI   Hypertension Mother    Dementia Father    Colon cancer Neg Hx     Social History   Socioeconomic History   Marital status: Single    Spouse name: Not on file   Number of children: 5   Years of education: Not on file   Highest education level: Not on file  Occupational History   Occupation: student  Tobacco Use   Smoking status: Never   Smokeless tobacco: Never  Substance and Sexual Activity   Alcohol use: No   Drug use: No   Sexual activity: Never  Other Topics Concern   Not on file  Social History Narrative   Not on file  Social Determinants of Health   Financial Resource Strain: Not on file  Food Insecurity: Not on file  Transportation Needs: Not on file  Physical Activity: Not on file  Stress: Not on file  Social Connections: Not on file  Intimate Partner Violence: Not on file      Review of systems: All other review of systems negative except as mentioned in the HPI.   Physical Exam: Vitals:   09/04/21 1032  BP: (!) 148/76  Pulse: 76   Body mass index is 26.26 kg/m. Gen:      No acute distress HEENT:  sclera anicteric Abd:      soft, non-tender; no palpable masses, no distension Ext:    No edema Neuro: alert and oriented x 3 Psych: normal mood and affect  Data Reviewed:  Reviewed labs, radiology imaging, old records and  pertinent past GI work up   Assessment and Plan/Recommendations:  76 year old very pleasant female here for evaluation of chronic diarrhea  Her main complaint is change in stool consistency rather than increased bowel frequency, is likely secondary to intolerance/irritable bowel syndrome.  She has no nocturnal symptoms.  Advised patient to do a trial of lactose-free diet for 2 weeks Add Benefiber 1 tablespoon twice daily with meals  Provided information for Kegel exercises to improve anal sphincter tone  If continues to have persistent symptoms, will consider further work-up including colonoscopy for evaluation  Return in 3 to 4 weeks   The patient was provided an opportunity to ask questions and all were answered. The patient agreed with the plan and demonstrated an understanding of the instructions.  Damaris Hippo , MD    CC: Hayden Rasmussen, MD

## 2021-09-04 NOTE — Patient Instructions (Signed)
Your provider has requested that you go to the basement level for lab work before leaving today. Press "B" on the elevator. The lab is located at the first door on the left as you exit the elevator.  You can pick up lab kits or wait until you see if your symptoms have resolved, if not resolved please turn in your stool studies  Take Benefiber 1 tablespoon twice daily with meals  Do a Lactose free diet for 2 weeks  You have a follow up appointment with Randel Books  Kegel Exercises  Kegel exercises can help strengthen your pelvic floor muscles. The pelvic floor is a group of muscles that support your rectum, small intestine, and bladder. In females, pelvic floor muscles also help support the uterus. These muscles help you control the flow of urine and stool (feces). Kegel exercises are painless and simple. They do not require any equipment. Your provider may suggest Kegel exercises to: Improve bladder and bowel control. Improve sexual response. Improve weak pelvic floor muscles after surgery to remove the uterus (hysterectomy) or after pregnancy, in females. Improve weak pelvic floor muscles after prostate gland removal or surgery, in males. Kegel exercises involve squeezing your pelvic floor muscles. These are the same muscles you squeeze when you try to stop the flow of urine or keep from passing gas. The exercises can be done while sitting, standing, or lying down, but it is best to vary your position. Ask your health care provider which exercises are safe for you. Do exercises exactly as told by your health care provider and adjust them as directed. Do not begin these exercises until told by your health care provider. Exercises How to do Kegel exercises: Squeeze your pelvic floor muscles tight. You should feel a tight lift in your rectal area. If you are a female, you should also feel a tightness in your vaginal area. Keep your stomach, buttocks, and legs relaxed. Hold the muscles tight  for up to 10 seconds. Breathe normally. Relax your muscles for up to 10 seconds. Repeat as told by your health care provider. Repeat this exercise daily as told by your health care provider. Continue to do this exercise for at least 4-6 weeks, or for as long as told by your health care provider. You may be referred to a physical therapist who can help you learn more about how to do Kegel exercises. Depending on your condition, your health care provider may recommend: Varying how long you squeeze your muscles. Doing several sets of exercises every day. Doing exercises for several weeks. Making Kegel exercises a part of your regular exercise routine. This information is not intended to replace advice given to you by your health care provider. Make sure you discuss any questions you have with your health care provider. Document Revised: 05/31/2020 Document Reviewed: 05/31/2020 Elsevier Patient Education  [place].  Due to recent changes in healthcare laws, you may see the results of your imaging and laboratory studies on MyChart before your provider has had a chance to review them.  We understand that in some cases there may be results that are confusing or concerning to you. Not all laboratory results come back in the same time frame and the provider may be waiting for multiple results in order to interpret others.  Please give [place] 48 hours in order for your provider to thoroughly review all the results before contacting the office for clarification of your results.    I appreciate the  opportunity to  care for you  Thank You   Harl Bowie , MD

## 2021-09-23 ENCOUNTER — Encounter: Payer: Self-pay | Admitting: Gastroenterology

## 2021-10-22 ENCOUNTER — Other Ambulatory Visit: Payer: Medicare (Managed Care)

## 2021-10-22 ENCOUNTER — Ambulatory Visit: Payer: Medicare (Managed Care) | Admitting: Gastroenterology

## 2021-10-22 ENCOUNTER — Telehealth: Payer: Self-pay | Admitting: Gastroenterology

## 2021-10-22 DIAGNOSIS — K529 Noninfective gastroenteritis and colitis, unspecified: Secondary | ICD-10-CM

## 2021-10-22 DIAGNOSIS — R152 Fecal urgency: Secondary | ICD-10-CM

## 2021-10-22 DIAGNOSIS — R159 Full incontinence of feces: Secondary | ICD-10-CM

## 2021-10-22 DIAGNOSIS — E739 Lactose intolerance, unspecified: Secondary | ICD-10-CM

## 2021-10-22 NOTE — Telephone Encounter (Signed)
This was in Patty's box but it looks like she has already been rescheduled.

## 2021-10-22 NOTE — Telephone Encounter (Signed)
Patient called states she is running late to her appointment at 11:00 am. Requesting a call back.

## 2021-10-22 NOTE — Telephone Encounter (Signed)
Noted: Patient is rescheduled for 11-19-21 with Alonza Bogus.

## 2021-10-28 LAB — PANCREATIC ELASTASE, FECAL: Pancreatic Elastase-1, Stool: 406 mcg/g

## 2021-11-19 ENCOUNTER — Encounter: Payer: Self-pay | Admitting: Gastroenterology

## 2021-11-19 ENCOUNTER — Ambulatory Visit (INDEPENDENT_AMBULATORY_CARE_PROVIDER_SITE_OTHER): Payer: Medicare (Managed Care) | Admitting: Gastroenterology

## 2021-11-19 VITALS — BP 134/92 | HR 87 | Ht 64.0 in | Wt 152.0 lb

## 2021-11-19 DIAGNOSIS — K529 Noninfective gastroenteritis and colitis, unspecified: Secondary | ICD-10-CM

## 2021-11-19 NOTE — Progress Notes (Signed)
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  She was seen by me back in 2015 for complaints of diarrhea.  Please see that note from 05/20/2013 for further details.  At that time she was scheduled for colonoscopy.  Patient underwent colonoscopy by Dr. Hilarie Fredrickson in May 2015 that showed only diverticulosis.  Celiac labs were negative back then.  Of note, she did have another colonoscopy in 2005 in [place] that was unremarkable.  She then did not return here again until August of this year she saw Dr. Silverio Decamp.  At that time Dr. Silverio Decamp checked a pancreatic fecal elastase that was negative.  She was told to follow a lactose-free diet and try Benefiber.  She is here now today for follow-up.  Lactose-free diet has not made any difference in her symptoms.  She has been using Lomotil and says that does help her symptoms, but if there is another underlying cause of her symptoms she would like to try to figure it out.  She says that she has "the runs" every day, sounds like mostly in the mornings.   Past Medical History:  Diagnosis Date   Allergy    Arthritis    Cataracts, bilateral    Diverticulosis of colon (without mention of hemorrhage) 2005   Eczema    Lactose intolerance    per pt   Past Surgical History:  Procedure Laterality Date   CARPAL TUNNEL RELEASE Right    VAGINAL HYSTERECTOMY      reports that she has never smoked. She has never used smokeless tobacco. She reports that she does not drink alcohol and does not use drugs. family history includes Dementia in her father; Heart disease (age of onset: 18) in her mother; Hypertension in her mother. Allergies  Allergen Reactions   Latex     itching   Other     Cigarette smoke- allergies      Outpatient Encounter Medications as of 11/19/2021  Medication Sig   diphenoxylate-atropine (LOMOTIL)  2.5-0.025 MG tablet Take 1 tablet by mouth 4 (four) times daily as needed.   estradiol (ESTRACE) 0.5 MG tablet Take 1 tablet (0.5 mg total) by mouth daily.   ibuprofen (ADVIL) 800 MG tablet Take 800 mg by mouth every 8 (eight) hours as needed.   losartan (COZAAR) 25 MG tablet Take 25 mg by mouth daily.   Prenatal 27-1 MG TABS Take 1 tablet by mouth daily.   Red Yeast Rice 600 MG CAPS Take 1 capsule (600 mg total) by mouth daily.   Saccharomyces boulardii (PROBIOTIC) 250 MG CAPS Take 1 capsule by mouth daily.   No facility-administered encounter medications on file as of 11/19/2021.    REVIEW OF SYSTEMS  : All other systems reviewed and negative except where noted in the History of Present Illness.   PHYSICAL EXAM: BP (!) 134/92   Pulse 87   Ht '5\' 4"'$  (1.626 m)   Wt 152 lb (68.9 kg)   SpO2 98%   BMI 26.09 kg/m  General: Well developed white female in no acute distress Head: Normocephalic and atraumatic Eyes:  Sclerae anicteric, conjunctiva pink. Ears: Normal auditory acuity Lungs: Clear throughout to auscultation; no W/R/R. Heart: Regular rate and rhythm; no M/R/G. Abdomen: Soft, non-distended.  BS present and hyperactive.  Non-tender. Rectal:  Will be done at the time of colonoscopy. Musculoskeletal: Symmetrical with no  gross deformities  Skin: No lesions on visible extremities Extremities: No edema  Neurological: Alert oriented x 4, grossly non-focal Psychological:  Alert and cooperative. Normal mood and affect  ASSESSMENT AND PLAN: *Chronic diarrhea: Was checked for celiac disease in the past.  Colonoscopy is unremarkable in 2005 and 2015.  Lactose-free diet has not helped.  I think that she needs a repeat colonoscopy with random biopsies to rule out microscopic colitis.  Will schedule with Dr. Silverio Decamp.  The risks, benefits, and alternatives to colonoscopy were discussed with the patient and she consents to proceed.  She is using Lomotil currently, which does help.  Was advised  that if the microscopic colitis come back negative then this may all be IBS and she certainly can go forward managing her symptoms with daily Lomotil use.   CC:  Hayden Rasmussen, MD

## 2021-11-19 NOTE — Patient Instructions (Signed)
_______________________________________________________  If you are age 78 or older, your body mass index should be between 23-30. Your Body mass index is 26.09 kg/m. If this is out of the aforementioned range listed, please consider follow up with your Primary Care Provider.  If you are age 67 or younger, your body mass index should be between 19-25. Your Body mass index is 26.09 kg/m. If this is out of the aformentioned range listed, please consider follow up with your Primary Care Provider.   ________________________________________________________  The [facility] providers would like to encourage you to use [facility] to communicate with providers for non-urgent requests or questions.  Due to long hold times on the telephone, sending your provider a message by [facility] may be a faster and more efficient way to get a response.  Please allow 48 business hours for a response.  Please remember that this is for non-urgent requests.  _______________________________________________________  Dennis Bast have been scheduled for a colonoscopy. Please follow written instructions given to you at your visit today.  Please pick up your prep supplies at the pharmacy within the next 1-3 days. If you use inhalers (even only as needed), please bring them with you on the day of your procedure.  Due to recent changes in healthcare laws, you may see the results of your imaging and laboratory studies on MyChart before your provider has had a chance to review them.  We understand that in some cases there may be results that are confusing or concerning to you. Not all laboratory results come back in the same time frame and the provider may be waiting for multiple results in order to interpret others.  Please give [place] 48 hours in order for your provider to thoroughly review all the results before contacting the office for clarification of your results.   It was a pleasure to see you today!  Thank you for trusting me with your  gastrointestinal care!

## 2021-11-23 ENCOUNTER — Telehealth: Payer: Self-pay | Admitting: Gastroenterology

## 2021-11-23 NOTE — Telephone Encounter (Signed)
Patient has colonoscopy on 10/23.  She is asking if she can take her Imodium tomorrow, 10/22 so that she can go to church in the morning.  He said that my formal recommendation would be for her to NOT take it as we are going to be then bowel prepping her later that day for her colonoscopy, but knowing her ongoing issues with significant diarrhea I would likely not be totally opposed to her taking a single dose early in the morning although would not be ideal or preferable.

## 2021-11-25 ENCOUNTER — Ambulatory Visit (AMBULATORY_SURGERY_CENTER): Payer: Medicare (Managed Care) | Admitting: Gastroenterology

## 2021-11-25 ENCOUNTER — Encounter: Payer: Self-pay | Admitting: Gastroenterology

## 2021-11-25 VITALS — BP 147/78 | HR 72 | Temp 97.3°F | Resp 11 | Ht 64.0 in | Wt 153.0 lb

## 2021-11-25 DIAGNOSIS — K529 Noninfective gastroenteritis and colitis, unspecified: Secondary | ICD-10-CM | POA: Diagnosis not present

## 2021-11-25 DIAGNOSIS — K5730 Diverticulosis of large intestine without perforation or abscess without bleeding: Secondary | ICD-10-CM | POA: Diagnosis not present

## 2021-11-25 DIAGNOSIS — K649 Unspecified hemorrhoids: Secondary | ICD-10-CM | POA: Diagnosis not present

## 2021-11-25 MED ORDER — SODIUM CHLORIDE 0.9 % IV SOLN: 500.0000 mL | Freq: Once | INTRAVENOUS | Status: DC

## 2021-11-25 NOTE — Patient Instructions (Signed)
   RETURN TO OFFICE FOR FOLLOW UO APPOINTMENT - DR NANDIGAM'S OFFICE WILL MAKE THIS APPOINTMENT OR YOU CAN CALL AND MAKE THIS TOMORROW   AWAIT BIOPSY RESULTS DONE OF COLON  HANDOUT ON HEMORRHOIDS GIVEN TO YOU TODAY   YOU HAD AN ENDOSCOPIC PROCEDURE TODAY AT [facility]:   Refer to the procedure report that was given to you for any specific questions about what was found during the examination.  If the procedure report does not answer your questions, please call your gastroenterologist to clarify.  If you requested that your care partner not be given the details of your procedure findings, then the procedure report has been included in a sealed envelope for you to review at your convenience later.  YOU SHOULD EXPECT: Some feelings of bloating in the abdomen. Passage of more gas than usual.  Walking can help get rid of the air that was put into your GI tract during the procedure and reduce the bloating. If you had a lower endoscopy (such as a colonoscopy or flexible sigmoidoscopy) you may notice spotting of blood in your stool or on the toilet paper. If you underwent a bowel prep for your procedure, you may not have a normal bowel movement for a few days.  Please Note:  You might notice some irritation and congestion in your nose or some drainage.  This is from the oxygen used during your procedure.  There is no need for concern and it should clear up in a day or so.  SYMPTOMS TO REPORT IMMEDIATELY:  Following lower endoscopy (colonoscopy or flexible sigmoidoscopy):  Excessive amounts of blood in the stool  Significant tenderness or worsening of abdominal pains  Swelling of the abdomen that is new, acute  Fever of 100F or higher   For urgent or emergent issues, a gastroenterologist can be reached at any hour by calling [phone]. Do not use MyChart messaging for urgent concerns.    DIET:  We do recommend a small meal at first, but then you may proceed to your regular  diet.  Drink plenty of fluids but you should avoid alcoholic beverages for 24 hours.  ACTIVITY:  You should plan to take it easy for the rest of today and you should NOT DRIVE or use heavy machinery until tomorrow (because of the sedation medicines used during the test).    FOLLOW UP: Our staff will call the number listed on your records the next business day following your procedure.  We will call around 7:15- 8:00 am to check on you and address any questions or concerns that you may have regarding the information given to you following your procedure. If we do not reach you, we will leave a message.     If any biopsies were taken you will be contacted by phone or by letter within the next 1-3 weeks.  Please call us at [phone] if you have not heard about the biopsies in 3 weeks.    SIGNATURES/CONFIDENTIALITY: You and/or your care partner have signed paperwork which will be entered into your electronic medical record.  These signatures attest to the fact that that the information above on your After Visit Summary has been reviewed and is understood.  Full responsibility of the confidentiality of this discharge information lies with you and/or your care-partner.

## 2021-11-25 NOTE — Progress Notes (Signed)
Pt resting comfortably. VSS. Airway intact. SBAR complete to RN. All questions answered.   

## 2021-11-25 NOTE — Progress Notes (Unsigned)
Please refer to office visit note 11/19/21 by Alonza Bogus. No additional changes in H&P Patient is appropriate for planned procedure(s) and anesthesia in an ambulatory setting  K. Denzil Magnuson , MD [phone]

## 2021-11-25 NOTE — Op Note (Addendum)
[facility] Patient Name: Beverly Carter Procedure Date: 11/25/2021 3:56 PM MRN: [id number] Endoscopist: Mauri Pole , MD Age: 76 Referring MD:  Date of Birth: February 18, 1945 Gender: Female Account #: [id number] Procedure:                Colonoscopy Indications:              Clinically significant diarrhea of unexplained                            origin Medicines:                Monitored Anesthesia Care Procedure:                Pre-Anesthesia Assessment:                           - Prior to the procedure, a History and Physical                            was performed, and patient medications and                            allergies were reviewed. The patient's tolerance of                            previous anesthesia was also reviewed. The risks                            and benefits of the procedure and the sedation                            options and risks were discussed with the patient.                            All questions were answered, and informed consent                            was obtained. Prior Anticoagulants: The patient has                            taken no previous anticoagulant or antiplatelet                            agents. ASA Grade Assessment: II - A patient with                            mild systemic disease. After reviewing the risks                            and benefits, the patient was deemed in                            satisfactory condition to undergo the procedure.  After obtaining informed consent, the colonoscope                            was passed under direct vision. Throughout the                            procedure, the patient's blood pressure, pulse, and                            oxygen saturations were monitored continuously. The                            Olympus PCF-H190DL ([id number]) Colonoscope was                            introduced through the anus and advanced to the  the                            cecum, identified by appendiceal orifice and                            ileocecal valve. The colonoscopy was performed                            without difficulty. The patient tolerated the                            procedure well. The quality of the bowel                            preparation was fair. The ileocecal valve,                            appendiceal orifice, and rectum were photographed. Scope In: 4:09:16 PM Scope Out: 4:28:02 PM Scope Withdrawal Time: 0 hours 8 minutes 28 seconds  Total Procedure Duration: 0 hours 18 minutes 46 seconds  Findings:                 The perianal and digital rectal examinations were                            normal.                           Scattered small and large-mouthed diverticula were                            found in the sigmoid colon, descending colon,                            transverse colon and ascending colon. There was                            evidence of an impacted diverticulum.  Normal mucosa was found in the entire colon.                            Biopsies for histology were taken with a cold                            forceps from the right colon and left colon for                            evaluation of microscopic colitis.                           Non-bleeding external and internal hemorrhoids were                            found during retroflexion. The hemorrhoids were                            medium-sized. Complications:            No immediate complications. Estimated Blood Loss:     Estimated blood loss was minimal. Impression:               - Preparation of the colon was fair.                           - Severe diverticulosis in the sigmoid colon, in                            the descending colon, in the transverse colon and                            in the ascending colon. There was evidence of an                            impacted  diverticulum.                           - Normal mucosa in the entire examined colon.                            Biopsied.                           - Non-bleeding external and internal hemorrhoids. Recommendation:           - Patient has a contact number available for                            emergencies. The signs and symptoms of potential                            delayed complications were discussed with the                            patient. Return to normal activities tomorrow.  Written discharge instructions were provided to the                            patient.                           - Resume previous diet.                           - Continue present medications.                           - Await pathology results.                           - No repeat colonoscopy due to age.                           - Return to GI office at the next available                            appointment. Mauri Pole, MD 11/25/2021 4:31:56 PM This report has been signed electronically.

## 2021-11-25 NOTE — Progress Notes (Unsigned)
Pt's states no medical or surgical changes since previsit or office visit. VS assessed by D.T 

## 2021-11-26 ENCOUNTER — Telehealth: Payer: Self-pay

## 2021-11-26 ENCOUNTER — Telehealth: Payer: Self-pay | Admitting: Gastroenterology

## 2021-11-26 NOTE — Telephone Encounter (Signed)
Spoke with the patient. She is still having "leakage" of stool. Her colonoscopy for diarrhea was 11/25/21. Advised she will not see normal bowel movements for at least a week after a colonoscopy. She may continue the diarrhea. Pathology on colon biopsy pending. Understands this. Can take Imodium if needed. Keep follow up appointment as scheduled. We will move it if the provider wants to see her sooner.

## 2021-11-26 NOTE — Telephone Encounter (Signed)
This is a Dr Silverio Decamp pt I will route to her nurse.

## 2021-11-26 NOTE — Telephone Encounter (Signed)
Inbound call from patient wanting to make follow up visit post procedure, she was only interested in seeing [facility] or [facility], soonest available was [facility] on 11/28, patient is not sure if she should keep appointment if it is not needed. Please advise

## 2021-11-26 NOTE — Telephone Encounter (Signed)
  Follow up Call-     11/25/2021    3:07 PM  Call back number  Post procedure Call Back phone  # [phone]  Permission to leave phone message Yes     Patient questions:  Do you have a fever, pain , or abdominal swelling? No. Pain Score  0 *  Have you tolerated food without any problems? Yes.    Have you been able to return to your normal activities? Yes.    Do you have any questions about your discharge instructions: Diet   No. Medications  No. Follow up visit  No.  Do you have questions or concerns about your Care? No.  Actions: * If pain score is 4 or above: No action needed, pain <4.

## 2021-12-08 ENCOUNTER — Encounter: Payer: Self-pay | Admitting: Gastroenterology

## 2021-12-31 ENCOUNTER — Ambulatory Visit (INDEPENDENT_AMBULATORY_CARE_PROVIDER_SITE_OTHER): Payer: Medicare (Managed Care) | Admitting: Gastroenterology

## 2021-12-31 ENCOUNTER — Other Ambulatory Visit (INDEPENDENT_AMBULATORY_CARE_PROVIDER_SITE_OTHER): Payer: Medicare (Managed Care)

## 2021-12-31 ENCOUNTER — Encounter: Payer: Self-pay | Admitting: Gastroenterology

## 2021-12-31 VITALS — BP 124/70 | HR 75 | Ht 64.0 in | Wt 154.0 lb

## 2021-12-31 DIAGNOSIS — L659 Nonscarring hair loss, unspecified: Secondary | ICD-10-CM | POA: Insufficient documentation

## 2021-12-31 DIAGNOSIS — K529 Noninfective gastroenteritis and colitis, unspecified: Secondary | ICD-10-CM

## 2021-12-31 LAB — TSH: TSH: 1.76 u[IU]/mL (ref 0.35–5.50)

## 2021-12-31 NOTE — Progress Notes (Signed)
12/31/2021 Beverly Carter [id number] 03/06/1945   HISTORY OF PRESENT ILLNESS: This is a 76 year old female who is a patient of Dr. Woodward Ku.  She has been following here recently and also in the past for issues with chronic diarrhea.  Celiac labs were negative previously.  Lactose-free diet did not help.  Recent colonoscopy in October was unremarkable and random biopsies were normal/negative for microscopic colitis.  She says that since her colonoscopy her symptoms have been much better.  She has not required any Imodium or Lomotil much at all at this point.  She has been using Benefiber powder 2 teaspoons mixed in 8 ounces of liquid daily and thinks that that is helping with bulk.  She is also reporting some recent issues with hair loss and is asking about having her thyroid checked.   Past Medical History:  Diagnosis Date   Allergy    Arthritis    Cataracts, bilateral    Diverticulosis of colon (without mention of hemorrhage) 2005   Eczema    Lactose intolerance    per pt   Past Surgical History:  Procedure Laterality Date   CARPAL TUNNEL RELEASE Right    VAGINAL HYSTERECTOMY      reports that she has never smoked. She has never used smokeless tobacco. She reports that she does not drink alcohol and does not use drugs. family history includes Dementia in her father; Heart disease (age of onset: 46) in her mother; Hypertension in her mother. Allergies  Allergen Reactions   Latex     itching   Other     Cigarette smoke- allergies       Outpatient Encounter Medications as of 12/31/2021  Medication Sig   estradiol (ESTRACE) 0.5 MG tablet Take 1 tablet (0.5 mg total) by mouth daily.   ibuprofen (ADVIL) 800 MG tablet Take 800 mg by mouth every 8 (eight) hours as needed.   losartan (COZAAR) 25 MG tablet Take 25 mg by mouth daily.   Prenatal 27-1 MG TABS Take 1 tablet by mouth daily.   Red Yeast Rice 600 MG CAPS Take 1 capsule (600 mg total) by mouth daily.    [DISCONTINUED] diphenoxylate-atropine (LOMOTIL) 2.5-0.025 MG tablet Take 1 tablet by mouth 4 (four) times daily as needed.   [DISCONTINUED] Saccharomyces boulardii (PROBIOTIC) 250 MG CAPS Take 1 capsule by mouth daily.   No facility-administered encounter medications on file as of 12/31/2021.     REVIEW OF SYSTEMS  : All other systems reviewed and negative except where noted in the History of Present Illness.   PHYSICAL EXAM: BP 124/70   Pulse 75   Ht '5\' 4"'$  (1.626 m)   Wt 154 lb (69.9 kg)   SpO2 98%   BMI 26.43 kg/m  General: Well developed white female in no acute distress Head: Normocephalic and atraumatic Eyes:  Sclerae anicteric, conjunctiva pink. Ears: Normal auditory acuity Lungs: Clear throughout to auscultation; no W/R/R. Heart: Regular rate and rhythm; no M/R/G. Abdomen: Soft, non-distended.  BS present.  Non-tender. Musculoskeletal: Symmetrical with no gross deformities  Skin: No lesions on visible extremities Extremities: No edema  Neurological: Alert oriented x 4, grossly non-focal Psychological:  Alert and cooperative. Normal mood and affect  ASSESSMENT AND PLAN: *Chronic diarrhea: Celiac testing negative in the past, lactose-free diet did not help, recent colonoscopy unremarkable with normal random biopsies.  Since her colonoscopy her symptoms have been much improved.  Not even using the Imodium or Lomotil very often at this point.  Suspect her  symptoms are IBS related.  If symptoms are current certainly can use Imodium or Lomotil regularly or could consider trial of Viberzi as well.  Will check TSH as that was not checked since 2020 and she is also reporting some hair loss.  She is using Benefiber powder 2 teaspoons mixed in 8 ounces of liquid daily and thinks that that is helping with bulk.  Certainly can continue that.   CC:  Hayden Rasmussen, MD

## 2021-12-31 NOTE — Patient Instructions (Addendum)
    Your provider has requested that you go to the basement level for lab work before leaving today. Press "B" on the elevator. The lab is located at the first door on the left as you exit the elevator.   Due to recent changes in healthcare laws, you may see the results of your imaging and laboratory studies on MyChart before your provider has had a chance to review them.  We understand that in some cases there may be results that are confusing or concerning to you. Not all laboratory results come back in the same time frame and the provider may be waiting for multiple results in order to interpret others.  Please give [place] 48 hours in order for your provider to thoroughly review all the results before contacting the office for clarification of your results.   _______________________________________________________  If you are age 78 or older, your body mass index should be between 23-30. Your Body mass index is 26.43 kg/m. If this is out of the aforementioned range listed, please consider follow up with your Primary Care Provider.  If you are age 76 or younger, your body mass index should be between 19-25. Your Body mass index is 26.43 kg/m. If this is out of the aformentioned range listed, please consider follow up with your Primary Care Provider.   ________________________________________________________  The [facility] providers would like to encourage you to use [facility] to communicate with providers for non-urgent requests or questions.  Due to long hold times on the telephone, sending your provider a message by [facility] may be a faster and more efficient way to get a response.  Please allow 48 business hours for a response.  Please remember that this is for non-urgent requests.  _______________________________________________________      We will be in touch with results and plans. Alonza Bogus, PA-C

## 2022-04-28 ENCOUNTER — Other Ambulatory Visit: Payer: Self-pay | Admitting: Family Medicine

## 2022-04-28 DIAGNOSIS — Z1231 Encounter for screening mammogram for malignant neoplasm of breast: Secondary | ICD-10-CM

## 2022-06-12 ENCOUNTER — Ambulatory Visit
Admission: RE | Admit: 2022-06-12 | Discharge: 2022-06-12 | Disposition: A | Discharge status: Home / Self Care | Payer: Medicare (Managed Care) | Source: Ambulatory Visit | Attending: Family Medicine | Admitting: Family Medicine

## 2022-06-12 DIAGNOSIS — Z1231 Encounter for screening mammogram for malignant neoplasm of breast: Secondary | ICD-10-CM

## 2022-10-21 DIAGNOSIS — M25511 Pain in right shoulder: Secondary | ICD-10-CM | POA: Insufficient documentation

## 2022-10-28 DIAGNOSIS — M5412 Radiculopathy, cervical region: Secondary | ICD-10-CM | POA: Insufficient documentation

## 2022-11-12 DIAGNOSIS — M542 Cervicalgia: Secondary | ICD-10-CM | POA: Insufficient documentation

## 2022-11-19 ENCOUNTER — Emergency Department (HOSPITAL_COMMUNITY)
Admission: EM | Admit: 2022-11-19 | Discharge: 2022-11-20 | Disposition: A | Discharge status: Home / Self Care | Payer: Medicare (Managed Care) | Attending: Emergency Medicine | Admitting: Emergency Medicine

## 2022-11-19 ENCOUNTER — Other Ambulatory Visit: Payer: Self-pay

## 2022-11-19 ENCOUNTER — Encounter (HOSPITAL_COMMUNITY): Payer: Self-pay

## 2022-11-19 ENCOUNTER — Emergency Department (HOSPITAL_COMMUNITY): Payer: Medicare (Managed Care)

## 2022-11-19 DIAGNOSIS — Z91040 Latex allergy status: Secondary | ICD-10-CM | POA: Insufficient documentation

## 2022-11-19 DIAGNOSIS — R5381 Other malaise: Secondary | ICD-10-CM | POA: Diagnosis present

## 2022-11-19 DIAGNOSIS — R42 Dizziness and giddiness: Secondary | ICD-10-CM | POA: Diagnosis not present

## 2022-11-19 DIAGNOSIS — M542 Cervicalgia: Secondary | ICD-10-CM | POA: Insufficient documentation

## 2022-11-19 LAB — BASIC METABOLIC PANEL
Anion gap: 10 (ref 5–15)
BUN: 16 mg/dL (ref 8–23)
CO2: 24 mmol/L (ref 22–32)
Calcium: 9 mg/dL (ref 8.9–10.3)
Chloride: 103 mmol/L (ref 98–111)
Creatinine, Ser: 0.71 mg/dL (ref 0.44–1.00)
GFR, Estimated: >60 mL/min (ref >60)
Glucose, Bld: 98 mg/dL (ref 70–99)
Potassium: 3.7 mmol/L (ref 3.5–5.1)
Sodium: 137 mmol/L (ref 135–145)

## 2022-11-19 LAB — CBC
HCT: 38.5 % (ref 36.0–46.0)
Hemoglobin: 12.8 g/dL (ref 12.0–15.0)
MCH: 29.8 pg (ref 26.0–34.0)
MCHC: 33.2 g/dL (ref 30.0–36.0)
MCV: 89.5 fL (ref 80.0–100.0)
Platelets: 205 10*3/uL (ref 150–400)
RBC: 4.3 MIL/uL (ref 3.87–5.11)
RDW: 13 % (ref 11.5–15.5)
WBC: 6.1 10*3/uL (ref 4.0–10.5)
nRBC: 0 % (ref 0.0–0.2)

## 2022-11-19 LAB — TROPONIN I (HIGH SENSITIVITY): Troponin I (High Sensitivity): 7 ng/L (ref <18)

## 2022-11-19 LAB — CBG MONITORING, ED: Glucose-Capillary: 78 mg/dL (ref 70–99)

## 2022-11-19 LAB — URINALYSIS, ROUTINE W REFLEX MICROSCOPIC
Bilirubin Urine: NEGATIVE
Glucose, UA: NEGATIVE mg/dL
Hgb urine dipstick: NEGATIVE
Ketones, ur: NEGATIVE mg/dL
Leukocytes,Ua: NEGATIVE
Nitrite: NEGATIVE
Protein, ur: NEGATIVE mg/dL
Specific Gravity, Urine: 1.005 (ref 1.005–1.030)
pH: 6 (ref 5.0–8.0)

## 2022-11-19 MED ORDER — IOHEXOL 350 MG/ML SOLN
75.0000 mL | Freq: Once | INTRAVENOUS | Status: AC | PRN
  Administered 2022-11-19: 75 mL via INTRAVENOUS

## 2022-11-19 MED ORDER — ACETAMINOPHEN 325 MG PO TABS: 650.0000 mg | ORAL_TABLET | Freq: Once | ORAL | Status: DC

## 2022-11-19 NOTE — ED Triage Notes (Addendum)
Pt arrives via POV. Pt reports around 0300 she began experience worsening pain in her neck. She began feeling dizzy earlier this morning as well. She went to her physical therapy appointment around 1330 today for her chronic neck pain and they told her to come to the ER for the dizziness. Pt is AxOx4. NAD. NIHSS 0

## 2022-11-19 NOTE — ED Provider Notes (Signed)
[facility] Provider Note   CSN: [id number] Arrival date & time: 11/19/22  1555     History Chief Complaint  Patient presents with   Neck Pain   Dizziness    Wylodean Lozeau is a 77 y.o. female.  Patient past history significant for chronic neck pain presents to the emergency department concerns of neck pain and dizziness.  Reports that around 3 AM this morning she woke up with significant leg pain not typical for her.  Reports that typically she has poor range of motion in the neck regardless due to arthritic type changes.  She currently follows with neurosurgery as well as physical therapy.  Later on in the day, patient noted that she was feeling somewhat dizzy with postural changes as if the room was spinning but denies feeling any faintness or weakness.  Was seen at physical therapy around 1:30 PM today for her chronic neck pain and was advised to come to the emergency department for evaluation by another individual who was there who said that they were a nurse and were concerned that patient was experiencing a possible stroke.  At no time was patient experiencing any sort of aphasia, unilateral weakness or numbness, facial droop, or other acute neurological deficit.  States that she has previously had mild episodes of dizziness but not to the extent of the dizziness that she has noted today.  Denies any chest pain, shortness of breath, fevers, abdominal pain, nausea, vomiting or diarrhea.   Neck Pain Dizziness      Home Medications Prior to Admission medications   Medication Sig Start Date End Date Taking? Authorizing Provider  estradiol (ESTRACE) 0.5 MG tablet Take 1 tablet (0.5 mg total) by mouth daily. Patient taking differently: Take 0.5 mg by mouth every Monday, Wednesday, and Friday. 02/27/17  Yes Helane Rima, DO  ibuprofen (ADVIL) 800 MG tablet Take 800 mg by mouth every 8 (eight) hours as needed for mild pain (pain score 1-3)  or moderate pain (pain score 4-6).   Yes [provider]  losartan (COZAAR) 25 MG tablet Take 25 mg by mouth daily. 05/07/21  Yes [provider]  Prenatal 27-1 MG TABS Take 1 tablet by mouth daily. 08/06/21  Yes [provider]      Allergies    Latex and Other    Review of Systems   Review of Systems  Musculoskeletal:  Positive for neck pain.  Neurological:  Positive for dizziness.  All other systems reviewed and are negative.   Physical Exam Updated Vital Signs BP 138/79   Pulse 71   Temp 98 F (36.7 C)   Resp 18   Ht 5\' 5"  (1.651 m)   Wt 66.2 kg   SpO2 98%   BMI 24.30 kg/m  Physical Exam Vitals and nursing note reviewed.  Constitutional:      General: She is not in acute distress.    Appearance: She is well-developed.  HENT:     Head: Normocephalic and atraumatic.  Eyes:     Conjunctiva/sclera: Conjunctivae normal.  Cardiovascular:     Rate and Rhythm: Normal rate and regular rhythm.     Heart sounds: No murmur heard. Pulmonary:     Effort: Pulmonary effort is normal. No respiratory distress.     Breath sounds: Normal breath sounds.  Abdominal:     Palpations: Abdomen is soft.     Tenderness: There is no abdominal tenderness.  Musculoskeletal:  General: Tenderness present. No swelling.     Cervical back: Neck supple.     Comments: TTP along neck bilaterally, no obvious deformities noted. Limited ROM but chronic in nature. No meningeal signs.  Skin:    General: Skin is warm and dry.     Capillary Refill: Capillary refill takes less than 2 seconds.  Neurological:     General: No focal deficit present.     Mental Status: She is alert and oriented to person, place, and time. Mental status is at baseline.     Cranial Nerves: No cranial nerve deficit.     Motor: No weakness.     Comments: CN II-XII intact. Unable to assess using HINTS given pain in neck.  Psychiatric:        Mood and Affect: Mood normal.     ED Results /  Procedures / Treatments   Labs (all labs ordered are listed, but only abnormal results are displayed) Labs Reviewed  URINALYSIS, ROUTINE W REFLEX MICROSCOPIC - Abnormal; Notable for the following components:      Result Value   Color, Urine STRAW (*)    All other components within normal limits  BASIC METABOLIC PANEL  CBC  CBG MONITORING, ED  TROPONIN I (HIGH SENSITIVITY)    EKG None  Radiology CT ANGIO HEAD NECK W WO CM  Result Date: 11/20/2022 CLINICAL DATA:  Vertigo all, headache EXAM: CT ANGIOGRAPHY HEAD AND NECK WITH AND WITHOUT CONTRAST TECHNIQUE: Multidetector CT imaging of the head and neck was performed using the standard protocol during bolus administration of intravenous contrast. Multiplanar CT image reconstructions and MIPs were obtained to evaluate the vascular anatomy. Carotid stenosis measurements (when applicable) are obtained utilizing NASCET criteria, using the distal internal carotid diameter as the denominator. RADIATION DOSE REDUCTION: This exam was performed according to the departmental dose-optimization program which includes automated exposure control, adjustment of the mA and/or kV according to patient size and/or use of iterative reconstruction technique. CONTRAST:  75mL OMNIPAQUE IOHEXOL 350 MG/ML SOLN COMPARISON:  No prior CTA available, correlation is made with CT head 11/19/2022 FINDINGS: CT HEAD FINDINGS For noncontrast findings, please see same day CT head. CTA NECK FINDINGS Aortic arch: Standard branching. Imaged portion shows no evidence of aneurysm or dissection. No significant stenosis of the major arch vessel origins. Right carotid system: Evaluation is somewhat limited by motion. Within this limitation, no evidence of dissection, occlusion, or hemodynamically significant stenosis (greater than 50%). Left carotid system: Evaluation is somewhat limited by motion. Within this limitation, no evidence of dissection, occlusion, or hemodynamically significant  stenosis (greater than 50%). Vertebral arteries: No evidence of dissection, occlusion, or hemodynamically significant stenosis (greater than 50%). Skeleton: No acute osseous abnormality. Degenerative changes in the cervical spine. Other neck: No acute finding. Upper chest: No focal pulmonary opacity or pleural effusion. Review of the MIP images confirms the above findings CTA HEAD FINDINGS Anterior circulation: Both internal carotid arteries are patent to the termini, without significant stenosis. A1 segments patent. Normal anterior communicating artery. Anterior cerebral arteries are patent to their distal aspects without significant stenosis. No M1 stenosis or occlusion. MCA branches perfused to their distal aspects without significant stenosis. Posterior circulation: Vertebral arteries patent to the vertebrobasilar junction without significant stenosis. The right vertebral artery primarily supplies the right PICA posterior inferior cerebellar arteries patent proximally. Basilar is diminutive but patent to its distal aspect without significant stenosis. Superior cerebellar arteries patent proximally. Patent P1 segments, diminutive on the left. Near fetal origin of the  left PCA from a prominent left posterior communicating artery. PCAs perfused to their distal aspects without significant stenosis. The right posterior communicating artery is also patent. Venous sinuses: As permitted by contrast timing, patent. Anatomic variants: Near fetal origin of the left PCA. No evidence of aneurysm or vascular malformation. Review of the MIP images confirms the above findings IMPRESSION: 1. No intracranial large vessel occlusion or significant stenosis. 2. No hemodynamically significant stenosis in the neck. Electronically Signed   By: Wiliam Ke M.D.   On: 11/20/2022 01:43   CT Head Wo Contrast  Result Date: 11/20/2022 CLINICAL DATA:  Headache EXAM: CT HEAD WITHOUT CONTRAST TECHNIQUE: Contiguous axial images were  obtained from the base of the skull through the vertex without intravenous contrast. RADIATION DOSE REDUCTION: This exam was performed according to the departmental dose-optimization program which includes automated exposure control, adjustment of the mA and/or kV according to patient size and/or use of iterative reconstruction technique. COMPARISON:  03/19/2018 FINDINGS: Brain: There is atrophy and chronic small vessel disease changes. No acute intracranial abnormality. Specifically, no hemorrhage, hydrocephalus, mass lesion, acute infarction, or significant intracranial injury. Vascular: No hyperdense vessel or unexpected calcification. Skull: No acute calvarial abnormality. Sinuses/Orbits: No acute findings Other: None IMPRESSION: Atrophy, chronic microvascular disease. No acute intracranial abnormality. Electronically Signed   By: Charlett Nose M.D.   On: 11/20/2022 00:07    Procedures Procedures   Medications Ordered in ED Medications  iohexol (OMNIPAQUE) 350 MG/ML injection 75 mL (75 mLs Intravenous Contrast Given 11/19/22 2113)    ED Course/ Medical Decision Making/ A&P                               Medical Decision Making Amount and/or Complexity of Data Reviewed Labs: ordered. Radiology: ordered.  Risk OTC drugs. Prescription drug management.   This patient presents to the ED for concern of neck pain, dizziness.  Differential diagnosis includes vertigo, chronic neck pain, vertebral artery dissection, stroke   Lab Tests:  I Ordered, and personally interpreted labs.  The pertinent results include: CBC unremarkable, 6 BMP unremarkable, troponin negative, UA without signs of infection   Imaging Studies ordered:  I ordered imaging studies including CT head, CT angio head and neck Imaging pending at time of handoff I agree with the radiologist interpretation   Medicines ordered and prescription drug management:  I ordered medication including Tylenol for  headache Reevaluation of the patient after these medicines showed that the patient improved I have reviewed the patients home medicines and have made adjustments as needed   Problem List / ED Course:  Patient presented to the emergency department today with concerns of neck pain and dizziness.  Reports that she has a chronic history of neck pain but woke up this morning around 3 AM with significantly worsening neck pain primarily in the left side.  Also endorses subsequent dizziness several hours later that is improved when patient is laying down but slightly worsened when she tries to change positions.  Feels that the room is spinning.  Has some history of mild dizziness but not to this extent.  Denies any feelings of weakness or faintness.  No chest pain or shortness of breath.  Was advised to come to the emergency department by physical therapy/other individual at physical therapy office out of concern that patient's symptoms may be correlating with a stroke.  At no time as patient experience any acute neurological deficit.  Code stroke  not initiated. Basic labs ordered for evaluation including CBC, BMP, troponin, urinalysis.  All labs are unremarkable at this time.  CBG 78.  CT head and CT angio head and neck are pending at this time.  Patient otherwise unremarkable with only mild headache and Tylenol ordered for management of this.   9:51PM Care of Laverle Patter transferred to Dr. Earlene Plater at the end of my shift as the patient will require reassessment once labs/imaging have resulted. Patient presentation, ED course, and plan of care discussed with review of all pertinent labs and imaging. Please see his/her note for further details regarding further ED course and disposition. Plan at time of handoff is await CT head and CT angio head and neck reads. Doubt acute stroke but MRI may be reasonable if patient still having persistent symptoms or worsening. If patient is stable, likely safe for discharge  and outpatient follow up. This may be altered or completely changed at the discretion of the oncoming team pending results of further workup.   Final Clinical Impression(s) / ED Diagnoses Final diagnoses:  Malaise    Rx / DC Orders ED Discharge Orders     None         Smitty Knudsen, PA-C 11/20/22 1525    Laurence Spates, MD 11/21/22 1500

## 2022-11-19 NOTE — ED Provider Notes (Signed)
  77 year old female presenting for dizziness, neck pain.  Patient is a history of chronic back problems for which she follows up physical therapy.  She chronically has pain to her left side and posterior neck.  Woke up at 3 AM with worsened neck pain.  No trauma.  Took some Advil and went back to bed.  Neck pain was better, but throughout the day she has noticed some gradual onset generalized headache similar to prior headaches, neck pain, feeling, foggy and generally unwell.  She has a nausea earlier, no chest pain or belly pain.  No vomiting.  No fevers or chills.  No vision changes or weakness or numbness.  She went to her physical therapist, who encouraged her to go to the ED for evaluation.  She describes her dizziness as generally feeling unwell.  She feels like there is a buzzing in her head.  No spinning sensation or fainting but is worse when she sits up.  Not worse with head movement.  On exam she is well-appearing.  She is awake and alert with a completely normal neurologic exam.  She has some little range of motion of her neck from prior muscle skeletal chronic neck pain, but states this is not new or more stiff than normal.  She feels somewhat worse sitting up, has a difficult time describing her dizziness.  I wonder if this may just be worsening chronic neck pain and headache.  I do not see any signs of fever, meningismus and I have low suspicion for CNS infection.  She is normal exam here, unclear if this dizziness could be related to possible TIA or stroke.  Will obtain CTA head neck to evaluate for arterial abnormality, treat her headache and reassess.  Also consider possible atypical ACS presentation overall favor less likely.  Will screen with EKG and troponin.  Handoff given to Dr. Preston Fleeting with plan to follow up CTA and reassess.   Laurence Spates, MD 11/21/22 [phone]

## 2022-11-20 NOTE — Discharge Instructions (Signed)
Drink plenty of fluids.  Take ibuprofen or naproxen as needed for pain. If needed, you can add acetaminophen to get additional pain relief.  Continue taking your muscle relaxer as needed.  Return if you have any new or concerning symptoms.

## 2022-11-20 NOTE — ED Provider Notes (Signed)
Care assumed from Dr. Earlene Plater, patient with neck pain and dizziness pending report on CTA head and neck.  CTA shows no acute process.  And come back to evaluate the patient, and she describes a general malaise rather than true dizziness.  I do not feel that she needs MRI to rule out stroke.  I am discharging her with instructions to make sure she stays hydrated, use over-the-counter NSAIDs and acetaminophen as needed for pain.  She is also getting physical therapy regarding neck pain and she is to continue with that.  She does have a muscle relaxer which she takes as needed (cyclobenzaprine), and I have advised her to continue using that as needed.  Return precautions discussed.  Results for orders placed or performed during the hospital encounter of 11/19/22  Basic metabolic panel  Result Value Ref Range   Sodium 137 135 - 145 mmol/L   Potassium 3.7 3.5 - 5.1 mmol/L   Chloride 103 98 - 111 mmol/L   CO2 24 22 - 32 mmol/L   Glucose, Bld 98 70 - 99 mg/dL   BUN 16 8 - 23 mg/dL   Creatinine, Ser 3.24 0.44 - 1.00 mg/dL   Calcium 9.0 8.9 - 40.1 mg/dL   GFR, Estimated >02 >72 mL/min   Anion gap 10 5 - 15  CBC  Result Value Ref Range   WBC 6.1 4.0 - 10.5 K/uL   RBC 4.30 3.87 - 5.11 MIL/uL   Hemoglobin 12.8 12.0 - 15.0 g/dL   HCT 53.6 64.4 - 03.4 %   MCV 89.5 80.0 - 100.0 fL   MCH 29.8 26.0 - 34.0 pg   MCHC 33.2 30.0 - 36.0 g/dL   RDW 74.2 59.5 - 63.8 %   Platelets 205 150 - 400 K/uL   nRBC 0.0 0.0 - 0.2 %  Urinalysis, Routine w reflex microscopic -Urine, Clean Catch  Result Value Ref Range   Color, Urine STRAW (A) YELLOW   APPearance CLEAR CLEAR   Specific Gravity, Urine 1.005 1.005 - 1.030   pH 6.0 5.0 - 8.0   Glucose, UA NEGATIVE NEGATIVE mg/dL   Hgb urine dipstick NEGATIVE NEGATIVE   Bilirubin Urine NEGATIVE NEGATIVE   Ketones, ur NEGATIVE NEGATIVE mg/dL   Protein, ur NEGATIVE NEGATIVE mg/dL   Nitrite NEGATIVE NEGATIVE   Leukocytes,Ua NEGATIVE NEGATIVE  CBG monitoring, ED  Result  Value Ref Range   Glucose-Capillary 78 70 - 99 mg/dL  Troponin I (High Sensitivity)  Result Value Ref Range   Troponin I (High Sensitivity) 7 <18 ng/L   CT ANGIO HEAD NECK W WO CM  Result Date: 11/20/2022 CLINICAL DATA:  Vertigo all, headache EXAM: CT ANGIOGRAPHY HEAD AND NECK WITH AND WITHOUT CONTRAST TECHNIQUE: Multidetector CT imaging of the head and neck was performed using the standard protocol during bolus administration of intravenous contrast. Multiplanar CT image reconstructions and MIPs were obtained to evaluate the vascular anatomy. Carotid stenosis measurements (when applicable) are obtained utilizing NASCET criteria, using the distal internal carotid diameter as the denominator. RADIATION DOSE REDUCTION: This exam was performed according to the departmental dose-optimization program which includes automated exposure control, adjustment of the mA and/or kV according to patient size and/or use of iterative reconstruction technique. CONTRAST:  75mL OMNIPAQUE IOHEXOL 350 MG/ML SOLN COMPARISON:  No prior CTA available, correlation is made with CT head 11/19/2022 FINDINGS: CT HEAD FINDINGS For noncontrast findings, please see same day CT head. CTA NECK FINDINGS Aortic arch: Standard branching. Imaged portion shows no evidence of aneurysm or  dissection. No significant stenosis of the major arch vessel origins. Right carotid system: Evaluation is somewhat limited by motion. Within this limitation, no evidence of dissection, occlusion, or hemodynamically significant stenosis (greater than 50%). Left carotid system: Evaluation is somewhat limited by motion. Within this limitation, no evidence of dissection, occlusion, or hemodynamically significant stenosis (greater than 50%). Vertebral arteries: No evidence of dissection, occlusion, or hemodynamically significant stenosis (greater than 50%). Skeleton: No acute osseous abnormality. Degenerative changes in the cervical spine. Other neck: No acute finding.  Upper chest: No focal pulmonary opacity or pleural effusion. Review of the MIP images confirms the above findings CTA HEAD FINDINGS Anterior circulation: Both internal carotid arteries are patent to the termini, without significant stenosis. A1 segments patent. Normal anterior communicating artery. Anterior cerebral arteries are patent to their distal aspects without significant stenosis. No M1 stenosis or occlusion. MCA branches perfused to their distal aspects without significant stenosis. Posterior circulation: Vertebral arteries patent to the vertebrobasilar junction without significant stenosis. The right vertebral artery primarily supplies the right PICA posterior inferior cerebellar arteries patent proximally. Basilar is diminutive but patent to its distal aspect without significant stenosis. Superior cerebellar arteries patent proximally. Patent P1 segments, diminutive on the left. Near fetal origin of the left PCA from a prominent left posterior communicating artery. PCAs perfused to their distal aspects without significant stenosis. The right posterior communicating artery is also patent. Venous sinuses: As permitted by contrast timing, patent. Anatomic variants: Near fetal origin of the left PCA. No evidence of aneurysm or vascular malformation. Review of the MIP images confirms the above findings IMPRESSION: 1. No intracranial large vessel occlusion or significant stenosis. 2. No hemodynamically significant stenosis in the neck. Electronically Signed   By: Wiliam Ke M.D.   On: 11/20/2022 01:43   CT Head Wo Contrast  Result Date: 11/20/2022 CLINICAL DATA:  Headache EXAM: CT HEAD WITHOUT CONTRAST TECHNIQUE: Contiguous axial images were obtained from the base of the skull through the vertex without intravenous contrast. RADIATION DOSE REDUCTION: This exam was performed according to the departmental dose-optimization program which includes automated exposure control, adjustment of the mA and/or kV  according to patient size and/or use of iterative reconstruction technique. COMPARISON:  03/19/2018 FINDINGS: Brain: There is atrophy and chronic small vessel disease changes. No acute intracranial abnormality. Specifically, no hemorrhage, hydrocephalus, mass lesion, acute infarction, or significant intracranial injury. Vascular: No hyperdense vessel or unexpected calcification. Skull: No acute calvarial abnormality. Sinuses/Orbits: No acute findings Other: None IMPRESSION: Atrophy, chronic microvascular disease. No acute intracranial abnormality. Electronically Signed   By: Charlett Nose M.D.   On: 11/20/2022 00:07      Dione Booze, MD 11/20/22 [phone]

## 2022-11-30 DIAGNOSIS — M47812 Spondylosis without myelopathy or radiculopathy, cervical region: Secondary | ICD-10-CM | POA: Insufficient documentation

## 2022-12-05 ENCOUNTER — Other Ambulatory Visit: Payer: Self-pay | Admitting: Medical Genetics

## 2022-12-05 DIAGNOSIS — Z006 Encounter for examination for normal comparison and control in clinical research program: Secondary | ICD-10-CM

## 2022-12-19 ENCOUNTER — Other Ambulatory Visit (HOSPITAL_COMMUNITY)
Admission: RE | Admit: 2022-12-19 | Discharge: 2022-12-19 | Disposition: A | Discharge status: Home / Self Care | Payer: Medicare (Managed Care) | Source: Ambulatory Visit | Attending: Oncology | Admitting: Oncology

## 2022-12-19 DIAGNOSIS — Z006 Encounter for examination for normal comparison and control in clinical research program: Secondary | ICD-10-CM | POA: Insufficient documentation

## 2022-12-30 LAB — GENECONNECT MOLECULAR SCREEN: Genetic Analysis Overall Interpretation: NEGATIVE

## 2023-03-25 DIAGNOSIS — M25662 Stiffness of left knee, not elsewhere classified: Secondary | ICD-10-CM | POA: Insufficient documentation

## 2023-03-27 NOTE — H&P (Signed)
 TOTAL KNEE ADMISSION H&P  Patient is being admitted for left total knee arthroplasty.  Subjective:  Chief Complaint: Left knee pain.  HPI: Beverly Carter, 78 y.o. female has a history of pain and functional disability in the left knee due to arthritis and has failed non-surgical conservative treatments for greater than 12 weeks to include NSAID's and/or analgesics, corticosteriod injections, viscosupplementation injections, and activity modification. Onset of symptoms was gradual, starting several years ago with gradually worsening course since that time. The patient noted no past surgery on the left knee.  Patient currently rates pain in the left knee at 8 out of 10 with activity. Patient has night pain, worsening of pain with activity and weight bearing, and pain that interferes with activities of daily living. Patient has evidence of  bone-on-bone arthritis in the medial and patellofemoral compartments with varus deformity and tibial subluxation  by imaging studies. There is no active infection.  Patient Active Problem List   Diagnosis Date Noted   Hair loss 12/31/2021   Chronic diarrhea 11/19/2021   Pure hypercholesterolemia 03/06/2017   Hormone replacement therapy (HRT) 03/06/2017   Arthritis of carpometacarpal (CMC) joints of both thumbs 03/20/2016   Bilateral carpal tunnel syndrome 03/20/2016   Vulvar atrophy 08/03/2014   DOE (dyspnea on exertion) 06/27/2014   Chest pain 06/27/2014   Tear of meniscus of knee 12/13/2013   Diarrhea 05/23/2013   Colon cancer screening 05/23/2013   Hyperlipidemia 03/09/2013   Atypical nevus of back 09/12/2012   Contact dermatitis 08/30/2012   Allergy    Eczema    Lactose intolerance    Postmenopausal HRT (hormone replacement therapy) 06/01/2012    Past Medical History:  Diagnosis Date   Allergy    Arthritis    Cataracts, bilateral    Diverticulosis of colon (without mention of hemorrhage) 2005   Eczema    Lactose intolerance    per pt     Past Surgical History:  Procedure Laterality Date   CARPAL TUNNEL RELEASE Right    VAGINAL HYSTERECTOMY      Prior to Admission medications   Medication Sig Start Date End Date Taking? Authorizing Provider  estradiol (ESTRACE) 0.5 MG tablet Take 1 tablet (0.5 mg total) by mouth daily. Patient taking differently: Take 0.5 mg by mouth every Monday, Wednesday, and Friday. 02/27/17   Helane Rima, DO  ibuprofen (ADVIL) 800 MG tablet Take 800 mg by mouth every 8 (eight) hours as needed for mild pain (pain score 1-3) or moderate pain (pain score 4-6).    [provider]  losartan (COZAAR) 25 MG tablet Take 25 mg by mouth daily. 05/07/21   [provider]  Prenatal 27-1 MG TABS Take 1 tablet by mouth daily. 08/06/21   [provider]    Allergies  Allergen Reactions   Latex     itching   Other     Cigarette smoke- allergies     Social History   Socioeconomic History   Marital status: Single    Spouse name: Not on file   Number of children: 5   Years of education: Not on file   Highest education level: Not on file  Occupational History   Occupation: student  Tobacco Use   Smoking status: Never   Smokeless tobacco: Never  Vaping Use   Vaping status: Never Used  Substance and Sexual Activity   Alcohol use: No   Drug use: No   Sexual activity: Not Currently  Other Topics Concern   Not on file  Social History Narrative   Not on file   Social Drivers of Health   Financial Resource Strain: Not on file  Food Insecurity: Not on file  Transportation Needs: Not on file  Physical Activity: Not on file  Stress: Not on file  Social Connections: Unknown (06/18/2021)   Received from [facility], [facility]   Social Network    Social Network: Not on file  Intimate Partner Violence: Unknown (05/10/2021)   Received from [facility], [facility]   HITS    Physically Hurt: Not on file    Insult or Talk Down To: Not on file    Threaten Physical  Harm: Not on file    Scream or Curse: Not on file    Tobacco Use: Low Risk  (11/19/2022)   Patient History    Smoking Tobacco Use: Never    Smokeless Tobacco Use: Never    Passive Exposure: Not on file   Social History   Substance and Sexual Activity  Alcohol Use No    Family History  Problem Relation Age of Onset   Heart disease Mother 76       MI   Hypertension Mother    Dementia Father    Colon cancer Neg Hx    Stomach cancer Neg Hx     ROS  Objective:  Physical Exam: Well nourished and well developed.  General: Alert and oriented x3, cooperative and pleasant, no acute distress.  Head: normocephalic, atraumatic, neck supple.  Eyes: EOMI. Abdomen: non-tender to palpation and soft, normoactive bowel sounds. Musculoskeletal: - Left knee shows no effusion. Range about 0-125 with moderate crepitus on range of motion. She is tender anteromedially. There is no lateral tenderness or instability noted. Her gait pattern is slightly antalgic on the left. Calves soft and nontender. Motor function intact in LE. Strength 5/5 LE bilaterally. Neuro: Distal pulses 2+. Sensation to light touch intact in LE.  Vital signs in last 24 hours: BP: ()/()  Arterial Line BP: ()/()   Imaging Review Plain radiographs demonstrate severe degenerative joint disease of the left knee. The overall alignment is mild varus. The bone quality appears to be adequate for age and reported activity level.  Assessment/Plan:  End stage arthritis, left knee   The patient history, physical examination, clinical judgment of the provider and imaging studies are consistent with end stage degenerative joint disease of the left knee and total knee arthroplasty is deemed medically necessary. The treatment options including medical management, injection therapy arthroscopy and arthroplasty were discussed at length. The risks and benefits of total knee arthroplasty were presented and reviewed. The risks due to  aseptic loosening, infection, stiffness, patella tracking problems, thromboembolic complications and other imponderables were discussed. The patient acknowledged the explanation, agreed to proceed with the plan and consent was signed. Patient is being admitted for inpatient treatment for surgery, pain control, PT, OT, prophylactic antibiotics, VTE prophylaxis, progressive ambulation and ADLs and discharge planning. The patient is planning to be discharged  home .  Patient's anticipated LOS is less than 2 midnights, meeting these requirements: - Lives within 1 hour of care - Has a competent adult at home to recover with post-op - NO history of  - Chronic pain requiring opiods  - Diabetes  - Coronary Artery Disease  - Heart failure  - Heart attack  - Stroke  - DVT/VTE  - Cardiac arrhythmia  - Respiratory Failure/COPD  - Renal failure  - Anemia  - Advanced Liver disease  Therapy Plans: EO  Disposition: Home with Friend Planned DVT Prophylaxis: Aspirin 81 mg BID DME Needed: None - checking with friend PCP: Hal Hope, MD (contacting for clearance) TXA: IV Allergies: latex (rash) Anesthesia Concerns: chronic neck pain BMI: 25.1 Last HgbA1c: not diabetic  Pharmacy: [facility] (deliver to room)  - Patient was instructed on what medications to stop prior to surgery. - Follow-up visit in 2 weeks with Dr. Lequita Halt - Begin physical therapy following surgery - Pre-operative lab work as pre-surgical testing - Prescriptions will be provided in hospital at time of discharge  R. Arcola Jansky, PA-C Orthopedic Surgery [facility] [place]

## 2023-04-08 NOTE — Progress Notes (Addendum)
 COVID Vaccine Completed:  Date of COVID positive in last 90 days:  PCP - Nadyne Coombes, MD Cardiologist - Tobias Alexander, MD LOV 02/04/17  Medical clearance by Nadyne Coombes, MD in media tab dated 03/25/23  Chest x-ray -  EKG - 11/20/22 Epic Stress Test - 07/17/14 Epic ECHO - 07/17/14 Epic Cardiac Cath -  Pacemaker/ICD device last checked: Spinal Cord Stimulator:  Bowel Prep -   Sleep Study -  CPAP -   Fasting Blood Sugar -  Checks Blood Sugar _____ times a day  Last dose of GLP1 agonist-  N/A GLP1 instructions:  Hold 7 days before surgery    Last dose of SGLT-2 inhibitors-  N/A SGLT-2 instructions:  Hold 3 days before surgery    Blood Thinner Instructions:  Last dose:   Time: Aspirin Instructions: Last Dose:  Activity level:  Can go up a flight of stairs and perform activities of daily living without stopping and without symptoms of chest pain or shortness of breath.  Able to exercise without symptoms  Unable to go up a flight of stairs without symptoms of     Anesthesia review: never followed up with cards.  Patient denies shortness of breath, fever, cough and chest pain at PAT appointment  Patient verbalized understanding of instructions that were given to them at the PAT appointment. Patient was also instructed that they will need to review over the PAT instructions again at home before surgery.

## 2023-04-08 NOTE — Patient Instructions (Signed)
 SURGICAL WAITING ROOM VISITATION  Patients having surgery or a procedure may have no more than 2 support people in the waiting area - these visitors may rotate.    Children under the age of 50 must have an adult with them who is not the patient.  Due to an increase in RSV and influenza rates and associated hospitalizations, children ages 24 and under may not visit patients in [facility] hospitals.  Visitors with respiratory illnesses are discouraged from visiting and should remain at home.  If the patient needs to stay at the hospital during part of their recovery, the visitor guidelines for inpatient rooms apply. Pre-op nurse will coordinate an appropriate time for 1 support person to accompany patient in pre-op.  This support person may not rotate.    Please refer to the [facility] website for the visitor guidelines for Inpatients (after your surgery is over and you are in a regular room).    Your procedure is scheduled on: 04/20/23   Report to [facility] Main Entrance    Report to admitting at 8:05 AM   Call this number if you have problems the morning of surgery [phone]   Do not eat food :After Midnight.   After Midnight you may have the following liquids until 7:35 AM DAY OF SURGERY  Water Non-Citrus Juices (without pulp, NO RED-Apple, White grape, White cranberry) Black Coffee (NO MILK/CREAM OR CREAMERS, sugar ok)  Clear Tea (NO MILK/CREAM OR CREAMERS, sugar ok) regular and decaf                             Plain Jell-O (NO RED)                                           Fruit ices (not with fruit pulp, NO RED)                                     Popsicles (NO RED)                                                               Sports drinks like Gatorade (NO RED)                 The day of surgery:  Drink ONE (1) Pre-Surgery Clear Ensure at 7:35 AM the morning of surgery. Drink in one sitting. Do not sip.  This drink was given to you during your hospital   pre-op appointment visit. Nothing else to drink after completing the  Pre-Surgery Clear Ensure.          If you have questions, please contact your surgeon's office.   FOLLOW BOWEL PREP AND ANY ADDITIONAL PRE OP INSTRUCTIONS YOU RECEIVED FROM YOUR SURGEON'S OFFICE!!!     Oral Hygiene is also important to reduce your risk of infection.                                    Remember -  BRUSH YOUR TEETH THE MORNING OF SURGERY WITH YOUR REGULAR TOOTHPASTE  DENTURES WILL BE REMOVED PRIOR TO SURGERY PLEASE DO NOT APPLY "Poly grip" OR ADHESIVES!!!   Stop all vitamins and herbal supplements 7 days before surgery.   Take these medicines the morning of surgery with A SIP OF WATER: None              You may not have any metal on your body including hair pins, jewelry, and body piercing             Do not wear make-up, lotions, powders, perfumes, or deodorant  Do not wear nail polish including gel and S&S, artificial/acrylic nails, or any other type of covering on natural nails including finger and toenails. If you have artificial nails, gel coating, etc. that needs to be removed by a nail salon please have this removed prior to surgery or surgery may need to be canceled/ delayed if the surgeon/ anesthesia feels like they are unable to be safely monitored.   Do not shave  48 hours prior to surgery.    Do not bring valuables to the hospital. [facility] IS NOT             RESPONSIBLE   FOR VALUABLES.   Contacts, glasses, dentures or bridgework may not be worn into surgery.   Bring small overnight bag day of surgery.   DO NOT BRING YOUR HOME MEDICATIONS TO THE HOSPITAL. PHARMACY WILL DISPENSE MEDICATIONS LISTED ON YOUR MEDICATION LIST TO YOU DURING YOUR ADMISSION IN THE HOSPITAL!    Special Instructions: Bring a copy of your healthcare power of attorney and living will documents the day of surgery if you haven't scanned them before.              Please read over the following fact sheets you  were given: IF YOU HAVE QUESTIONS ABOUT YOUR PRE-OP INSTRUCTIONS PLEASE CALL [name]    If you received a COVID test during your pre-op visit  it is requested that you wear a mask when out in public, stay away from anyone that may not be feeling well and notify your surgeon if you develop symptoms. If you test positive for Covid or have been in contact with anyone that has tested positive in the last 10 days please notify you surgeon.      Pre-operative 5 CHG Bath Instructions   You can play a key role in reducing the risk of infection after surgery. Your skin needs to be as free of germs as possible. You can reduce the number of germs on your skin by washing with CHG (chlorhexidine gluconate) soap before surgery. CHG is an antiseptic soap that kills germs and continues to kill germs even after washing.   DO NOT use if you have an allergy to chlorhexidine/CHG or antibacterial soaps. If your skin becomes reddened or irritated, stop using the CHG and notify one of our RNs at [phone].   Please shower with the CHG soap starting 4 days before surgery using the following schedule:     Please keep in mind the following:  DO NOT shave, including legs and underarms, starting the day of your first shower.   You may shave your face at any point before/day of surgery.  Place clean sheets on your bed the day you start using CHG soap. Use a clean washcloth (not used since being washed) for each shower. DO NOT sleep with pets once you start using the CHG.  CHG Shower Instructions:  If you choose to wash your hair and private area, wash first with your normal shampoo/soap.  After you use shampoo/soap, rinse your hair and body thoroughly to remove shampoo/soap residue.  Turn the water OFF and apply about 3 tablespoons (45 ml) of CHG soap to a CLEAN washcloth.  Apply CHG soap ONLY FROM YOUR NECK DOWN TO YOUR TOES (washing for 3-5 minutes)  DO NOT use CHG soap on face, private areas,  open wounds, or sores.  Pay special attention to the area where your surgery is being performed.  If you are having back surgery, having someone wash your back for you may be helpful. Wait 2 minutes after CHG soap is applied, then you may rinse off the CHG soap.  Pat dry with a clean towel  Put on clean clothes/pajamas   If you choose to wear lotion, please use ONLY the CHG-compatible lotions on the back of this paper.     Additional instructions for the day of surgery: DO NOT APPLY any lotions, deodorants, cologne, or perfumes.   Put on clean/comfortable clothes.  Brush your teeth.  Ask your nurse before applying any prescription medications to the skin.      CHG Compatible Lotions   Aveeno Moisturizing lotion  Cetaphil Moisturizing Cream  Cetaphil Moisturizing Lotion  Clairol Herbal Essence Moisturizing Lotion, Dry Skin  Clairol Herbal Essence Moisturizing Lotion, Extra Dry Skin  Clairol Herbal Essence Moisturizing Lotion, Normal Skin  Curel Age Defying Therapeutic Moisturizing Lotion with Alpha Hydroxy  Curel Extreme Care Body Lotion  Curel Soothing Hands Moisturizing Hand Lotion  Curel Therapeutic Moisturizing Cream, Fragrance-Free  Curel Therapeutic Moisturizing Lotion, Fragrance-Free  Curel Therapeutic Moisturizing Lotion, Original Formula  Eucerin Daily Replenishing Lotion  Eucerin Dry Skin Therapy Plus Alpha Hydroxy Crme  Eucerin Dry Skin Therapy Plus Alpha Hydroxy Lotion  Eucerin Original Crme  Eucerin Original Lotion  Eucerin Plus Crme Eucerin Plus Lotion  Eucerin TriLipid Replenishing Lotion  Keri Anti-Bacterial Hand Lotion  Keri Deep Conditioning Original Lotion Dry Skin Formula Softly Scented  Keri Deep Conditioning Original Lotion, Fragrance Free Sensitive Skin Formula  Keri Lotion Fast Absorbing Fragrance Free Sensitive Skin Formula  Keri Lotion Fast Absorbing Softly Scented Dry Skin Formula  Keri Original Lotion  Keri Skin Renewal Lotion Keri Silky  Smooth Lotion  Keri Silky Smooth Sensitive Skin Lotion  Nivea Body Creamy Conditioning Oil  Nivea Body Extra Enriched Lotion  Nivea Body Original Lotion  Nivea Body Sheer Moisturizing Lotion Nivea Crme  Nivea Skin Firming Lotion  NutraDerm 30 Skin Lotion  NutraDerm Skin Lotion  NutraDerm Therapeutic Skin Cream  NutraDerm Therapeutic Skin Lotion  ProShield Protective Hand Cream  Provon moisturizing lotion   Incentive Spirometer  An incentive spirometer is a tool that can help keep your lungs clear and active. This tool measures how well you are filling your lungs with each breath. Taking long deep breaths may help reverse or decrease the chance of developing breathing (pulmonary) problems (especially infection) following: A long period of time when you are unable to move or be active. BEFORE THE PROCEDURE  If the spirometer includes an indicator to show your best effort, your nurse or respiratory therapist will set it to a desired goal. If possible, sit up straight or lean slightly forward. Try not to slouch. Hold the incentive spirometer in an upright position. INSTRUCTIONS FOR USE  Sit on the edge of your bed if possible, or sit up as far as you can in bed or  on a chair. Hold the incentive spirometer in an upright position. Breathe out normally. Place the mouthpiece in your mouth and seal your lips tightly around it. Breathe in slowly and as deeply as possible, raising the piston or the ball toward the top of the column. Hold your breath for 3-5 seconds or for as long as possible. Allow the piston or ball to fall to the bottom of the column. Remove the mouthpiece from your mouth and breathe out normally. Rest for a few seconds and repeat Steps 1 through 7 at least 10 times every 1-2 hours when you are awake. Take your time and take a few normal breaths between deep breaths. The spirometer may include an indicator to show your best effort. Use the indicator as a goal to work toward  during each repetition. After each set of 10 deep breaths, practice coughing to be sure your lungs are clear. If you have an incision (the cut made at the time of surgery), support your incision when coughing by placing a pillow or rolled up towels firmly against it. Once you are able to get out of bed, walk around indoors and cough well. You may stop using the incentive spirometer when instructed by your caregiver.  RISKS AND COMPLICATIONS Take your time so you do not get dizzy or light-headed. If you are in pain, you may need to take or ask for pain medication before doing incentive spirometry. It is harder to take a deep breath if you are having pain. AFTER USE Rest and breathe slowly and easily. It can be helpful to keep track of a log of your progress. Your caregiver can provide you with a simple table to help with this. If you are using the spirometer at home, follow these instructions: SEEK MEDICAL CARE IF:  You are having difficultly using the spirometer. You have trouble using the spirometer as often as instructed. Your pain medication is not giving enough relief while using the spirometer. You develop fever of 100.5 F (38.1 C) or higher. SEEK IMMEDIATE MEDICAL CARE IF:  You cough up bloody sputum that had not been present before. You develop fever of 102 F (38.9 C) or greater. You develop worsening pain at or near the incision site. MAKE SURE YOU:  Understand these instructions. Will watch your condition. Will get help right away if you are not doing well or get worse. Document Released: 06/02/2006 Document Revised: 04/14/2011 Document Reviewed: 08/03/2006 [facility] Patient Information 2014 [place], [place].   ________________________________________________________________________

## 2023-04-09 ENCOUNTER — Encounter (HOSPITAL_COMMUNITY)
Admission: RE | Admit: 2023-04-09 | Discharge: 2023-04-09 | Disposition: A | Discharge status: Home / Self Care | Payer: Medicare (Managed Care) | Source: Ambulatory Visit | Attending: Orthopedic Surgery | Admitting: Orthopedic Surgery

## 2023-04-09 ENCOUNTER — Encounter (HOSPITAL_COMMUNITY): Payer: Self-pay

## 2023-04-09 ENCOUNTER — Other Ambulatory Visit: Payer: Self-pay

## 2023-04-09 VITALS — BP 162/75 | HR 64 | Temp 98.5°F | Resp 16 | Ht 66.0 in | Wt 151.0 lb

## 2023-04-09 DIAGNOSIS — Z01812 Encounter for preprocedural laboratory examination: Secondary | ICD-10-CM | POA: Insufficient documentation

## 2023-04-09 DIAGNOSIS — I10 Essential (primary) hypertension: Secondary | ICD-10-CM | POA: Diagnosis not present

## 2023-04-09 DIAGNOSIS — Z01818 Encounter for other preprocedural examination: Secondary | ICD-10-CM

## 2023-04-09 HISTORY — DX: Pneumonia, unspecified organism: J18.9

## 2023-04-09 HISTORY — DX: Anemia, unspecified: D64.9

## 2023-04-09 HISTORY — DX: Essential (primary) hypertension: I10

## 2023-04-09 LAB — BASIC METABOLIC PANEL WITH GFR
Anion gap: 8 (ref 5–15)
BUN: 18 mg/dL (ref 8–23)
CO2: 25 mmol/L (ref 22–32)
Calcium: 9.6 mg/dL (ref 8.9–10.3)
Chloride: 103 mmol/L (ref 98–111)
Creatinine, Ser: 0.76 mg/dL (ref 0.44–1.00)
GFR, Estimated: >60 mL/min
Glucose, Bld: 97 mg/dL (ref 70–99)
Potassium: 3.9 mmol/L (ref 3.5–5.1)
Sodium: 136 mmol/L (ref 135–145)

## 2023-04-09 LAB — SURGICAL PCR SCREEN
MRSA, PCR: NEGATIVE
Staphylococcus aureus: NEGATIVE

## 2023-04-09 LAB — CBC
HCT: 41.6 % (ref 36.0–46.0)
Hemoglobin: 13.5 g/dL (ref 12.0–15.0)
MCH: 30.2 pg (ref 26.0–34.0)
MCHC: 32.5 g/dL (ref 30.0–36.0)
MCV: 93.1 fL (ref 80.0–100.0)
Platelets: 223 10*3/uL (ref 150–400)
RBC: 4.47 MIL/uL (ref 3.87–5.11)
RDW: 13.2 % (ref 11.5–15.5)
WBC: 6.4 10*3/uL (ref 4.0–10.5)
nRBC: 0 % (ref 0.0–0.2)

## 2023-04-20 ENCOUNTER — Other Ambulatory Visit: Payer: Self-pay

## 2023-04-20 ENCOUNTER — Observation Stay (HOSPITAL_COMMUNITY)
Admission: RE | Admit: 2023-04-20 | Discharge: 2023-04-21 | Disposition: A | Discharge status: Home / Self Care | Payer: Medicare (Managed Care) | Source: Ambulatory Visit | Attending: Orthopedic Surgery | Admitting: Orthopedic Surgery

## 2023-04-20 ENCOUNTER — Encounter (HOSPITAL_COMMUNITY)
Admission: RE | Disposition: A | Discharge status: Home / Self Care | Payer: Self-pay | Source: Ambulatory Visit | Attending: Orthopedic Surgery

## 2023-04-20 ENCOUNTER — Ambulatory Visit (HOSPITAL_COMMUNITY): Payer: Medicare (Managed Care) | Admitting: Anesthesiology

## 2023-04-20 ENCOUNTER — Encounter (HOSPITAL_COMMUNITY): Payer: Self-pay | Admitting: Orthopedic Surgery

## 2023-04-20 DIAGNOSIS — Z79899 Other long term (current) drug therapy: Secondary | ICD-10-CM | POA: Insufficient documentation

## 2023-04-20 DIAGNOSIS — Z91040 Latex allergy status: Secondary | ICD-10-CM | POA: Diagnosis not present

## 2023-04-20 DIAGNOSIS — M1712 Unilateral primary osteoarthritis, left knee: Secondary | ICD-10-CM | POA: Diagnosis present

## 2023-04-20 DIAGNOSIS — I10 Essential (primary) hypertension: Secondary | ICD-10-CM | POA: Insufficient documentation

## 2023-04-20 DIAGNOSIS — M179 Osteoarthritis of knee, unspecified: Principal | ICD-10-CM | POA: Diagnosis present

## 2023-04-20 HISTORY — PX: TOTAL KNEE ARTHROPLASTY: SHX125

## 2023-04-20 SURGERY — ARTHROPLASTY, KNEE, TOTAL
Anesthesia: Spinal | Site: Knee | Laterality: Left

## 2023-04-20 MED ORDER — ACETAMINOPHEN 500 MG PO TABS
1000.0000 mg | ORAL_TABLET | Freq: Four times a day (QID) | ORAL | Status: AC
  Administered 2023-04-20 – 2023-04-21 (×3): 1000 mg via ORAL
  Filled 2023-04-20 (×4): qty 2

## 2023-04-20 MED ORDER — ROPIVACAINE HCL 5 MG/ML IJ SOLN
INTRAMUSCULAR | Status: DC | PRN
  Administered 2023-04-20: 20 mL via PERINEURAL

## 2023-04-20 MED ORDER — HYDROMORPHONE HCL 1 MG/ML IJ SOLN
0.2500 mg | INTRAMUSCULAR | Status: DC | PRN
  Administered 2023-04-20: 0.5 mg via INTRAVENOUS
  Administered 2023-04-20: 0.25 mg via INTRAVENOUS
  Administered 2023-04-20: 0.5 mg via INTRAVENOUS

## 2023-04-20 MED ORDER — MIDAZOLAM HCL 2 MG/2ML IJ SOLN
1.0000 mg | INTRAMUSCULAR | Status: DC
  Administered 2023-04-20: 1 mg via INTRAVENOUS
  Administered 2023-04-20: 2 mg via INTRAVENOUS
  Filled 2023-04-20: qty 2

## 2023-04-20 MED ORDER — DEXAMETHASONE SODIUM PHOSPHATE 10 MG/ML IJ SOLN
INTRAMUSCULAR | Status: AC
  Filled 2023-04-20: qty 1

## 2023-04-20 MED ORDER — ACETAMINOPHEN 10 MG/ML IV SOLN
1000.0000 mg | Freq: Once | INTRAVENOUS | Status: AC
  Administered 2023-04-20: 1000 mg via INTRAVENOUS
  Filled 2023-04-20: qty 100

## 2023-04-20 MED ORDER — OXYCODONE HCL 5 MG/5ML PO SOLN: 5.0000 mg | Freq: Once | ORAL | Status: DC | PRN

## 2023-04-20 MED ORDER — FLEET ENEMA RE ENEM: 1.0000 | ENEMA | Freq: Once | RECTAL | Status: DC | PRN

## 2023-04-20 MED ORDER — POVIDONE-IODINE 10 % EX SWAB: 2.0000 | Freq: Once | CUTANEOUS | Status: DC

## 2023-04-20 MED ORDER — BISACODYL 10 MG RE SUPP: 10.0000 mg | Freq: Every day | RECTAL | Status: DC | PRN

## 2023-04-20 MED ORDER — PHENOL 1.4 % MT LIQD
1.0000 | OROMUCOSAL | Status: DC | PRN
  Administered 2023-04-20: 1 via OROMUCOSAL
  Filled 2023-04-20: qty 177

## 2023-04-20 MED ORDER — METHOCARBAMOL 500 MG PO TABS: 500.0000 mg | ORAL_TABLET | Freq: Four times a day (QID) | ORAL | Status: DC | PRN

## 2023-04-20 MED ORDER — DEXAMETHASONE SODIUM PHOSPHATE 10 MG/ML IJ SOLN
10.0000 mg | Freq: Once | INTRAMUSCULAR | Status: AC
  Administered 2023-04-21: 10 mg via INTRAVENOUS
  Filled 2023-04-20: qty 1

## 2023-04-20 MED ORDER — BUPIVACAINE LIPOSOME 1.3 % IJ SUSP: 20.0000 mL | Freq: Once | INTRAMUSCULAR | Status: DC

## 2023-04-20 MED ORDER — CHLORHEXIDINE GLUCONATE 0.12 % MT SOLN
15.0000 mL | Freq: Once | OROMUCOSAL | Status: AC
Start: 2023-04-20 — End: 2023-04-20
  Administered 2023-04-20: 15 mL via OROMUCOSAL

## 2023-04-20 MED ORDER — POLYETHYLENE GLYCOL 3350 17 G PO PACK: 17.0000 g | PACK | Freq: Every day | ORAL | Status: DC | PRN

## 2023-04-20 MED ORDER — SODIUM CHLORIDE 0.9 % IR SOLN
Status: DC | PRN
  Administered 2023-04-20: 1000 mL

## 2023-04-20 MED ORDER — CEFAZOLIN SODIUM-DEXTROSE 2-4 GM/100ML-% IV SOLN
2.0000 g | Freq: Four times a day (QID) | INTRAVENOUS | Status: AC
  Administered 2023-04-20 (×2): 2 g via INTRAVENOUS
  Filled 2023-04-20 (×2): qty 100

## 2023-04-20 MED ORDER — 0.9 % SODIUM CHLORIDE (POUR BTL) OPTIME
TOPICAL | Status: DC | PRN
  Administered 2023-04-20: 1000 mL

## 2023-04-20 MED ORDER — METOCLOPRAMIDE HCL 5 MG/ML IJ SOLN: 5.0000 mg | Freq: Three times a day (TID) | INTRAMUSCULAR | Status: DC | PRN

## 2023-04-20 MED ORDER — ONDANSETRON HCL 4 MG/2ML IJ SOLN
INTRAMUSCULAR | Status: AC
  Filled 2023-04-20: qty 2

## 2023-04-20 MED ORDER — SODIUM CHLORIDE 0.9 % IV SOLN
12.5000 mg | INTRAVENOUS | Status: DC | PRN
  Filled 2023-04-20: qty 0.5

## 2023-04-20 MED ORDER — HYDROMORPHONE HCL 1 MG/ML IJ SOLN
INTRAMUSCULAR | Status: AC
Start: 2023-04-20 — End: 2023-04-20
  Administered 2023-04-20: 0.25 mg via INTRAVENOUS
  Filled 2023-04-20: qty 1

## 2023-04-20 MED ORDER — DIPHENHYDRAMINE HCL 12.5 MG/5ML PO ELIX: 12.5000 mg | ORAL_SOLUTION | ORAL | Status: DC | PRN

## 2023-04-20 MED ORDER — ONDANSETRON HCL 4 MG/2ML IJ SOLN: 4.0000 mg | Freq: Four times a day (QID) | INTRAMUSCULAR | Status: DC | PRN

## 2023-04-20 MED ORDER — LACTATED RINGERS IV SOLN: INTRAVENOUS | Status: DC

## 2023-04-20 MED ORDER — METHOCARBAMOL 1000 MG/10ML IJ SOLN: 500.0000 mg | Freq: Four times a day (QID) | INTRAMUSCULAR | Status: DC | PRN

## 2023-04-20 MED ORDER — FENTANYL CITRATE PF 50 MCG/ML IJ SOSY
50.0000 ug | PREFILLED_SYRINGE | INTRAMUSCULAR | Status: DC
  Filled 2023-04-20: qty 2

## 2023-04-20 MED ORDER — ONDANSETRON HCL 4 MG PO TABS: 4.0000 mg | ORAL_TABLET | Freq: Four times a day (QID) | ORAL | Status: DC | PRN

## 2023-04-20 MED ORDER — ONDANSETRON HCL 4 MG/2ML IJ SOLN
INTRAMUSCULAR | Status: DC | PRN
  Administered 2023-04-20: 4 mg via INTRAVENOUS

## 2023-04-20 MED ORDER — MORPHINE SULFATE (PF) 2 MG/ML IV SOLN: 1.0000 mg | INTRAVENOUS | Status: DC | PRN

## 2023-04-20 MED ORDER — SODIUM CHLORIDE 0.9 % IV SOLN: INTRAVENOUS | Status: DC

## 2023-04-20 MED ORDER — CEFAZOLIN SODIUM-DEXTROSE 2-4 GM/100ML-% IV SOLN
2.0000 g | INTRAVENOUS | Status: AC
  Administered 2023-04-20: 2 g via INTRAVENOUS
  Filled 2023-04-20: qty 100

## 2023-04-20 MED ORDER — TRANEXAMIC ACID-NACL 1000-0.7 MG/100ML-% IV SOLN
1000.0000 mg | INTRAVENOUS | Status: AC
  Administered 2023-04-20: 1000 mg via INTRAVENOUS
  Filled 2023-04-20: qty 100

## 2023-04-20 MED ORDER — ACETAMINOPHEN 325 MG PO TABS: 325.0000 mg | ORAL_TABLET | Freq: Four times a day (QID) | ORAL | Status: DC | PRN

## 2023-04-20 MED ORDER — ASPIRIN 81 MG PO CHEW
81.0000 mg | CHEWABLE_TABLET | Freq: Two times a day (BID) | ORAL | Status: DC
  Administered 2023-04-21: 81 mg via ORAL
  Filled 2023-04-20: qty 1

## 2023-04-20 MED ORDER — LOSARTAN POTASSIUM 25 MG PO TABS: 25.0000 mg | ORAL_TABLET | ORAL | Status: DC

## 2023-04-20 MED ORDER — METOCLOPRAMIDE HCL 5 MG PO TABS: 5.0000 mg | ORAL_TABLET | Freq: Three times a day (TID) | ORAL | Status: DC | PRN

## 2023-04-20 MED ORDER — MIDAZOLAM HCL 2 MG/2ML IJ SOLN
INTRAMUSCULAR | Status: AC
  Filled 2023-04-20: qty 2

## 2023-04-20 MED ORDER — FENTANYL CITRATE PF 50 MCG/ML IJ SOSY
50.0000 ug | PREFILLED_SYRINGE | INTRAMUSCULAR | Status: DC
  Administered 2023-04-20: 100 ug via INTRAVENOUS

## 2023-04-20 MED ORDER — OXYCODONE HCL 5 MG PO TABS
10.0000 mg | ORAL_TABLET | ORAL | Status: DC | PRN
  Administered 2023-04-20: 10 mg via ORAL

## 2023-04-20 MED ORDER — MENTHOL 3 MG MT LOZG: 1.0000 | LOZENGE | OROMUCOSAL | Status: DC | PRN

## 2023-04-20 MED ORDER — ORAL CARE MOUTH RINSE
15.0000 mL | Freq: Once | OROMUCOSAL | Status: AC
Start: 2023-04-20 — End: 2023-04-20

## 2023-04-20 MED ORDER — PROPOFOL 500 MG/50ML IV EMUL
INTRAVENOUS | Status: DC | PRN
  Administered 2023-04-20: 150 mg via INTRAVENOUS
  Administered 2023-04-20: 30 ug/kg/min via INTRAVENOUS

## 2023-04-20 MED ORDER — HYDROMORPHONE HCL 1 MG/ML IJ SOLN
INTRAMUSCULAR | Status: AC
  Administered 2023-04-20: 0.5 mg via INTRAVENOUS
  Filled 2023-04-20: qty 1

## 2023-04-20 MED ORDER — SODIUM CHLORIDE 0.9 % IV SOLN
INTRAVENOUS | Status: DC | PRN
  Administered 2023-04-20: 80 mL

## 2023-04-20 MED ORDER — OXYCODONE HCL 5 MG PO TABS
5.0000 mg | ORAL_TABLET | ORAL | Status: DC | PRN
  Administered 2023-04-20: 5 mg via ORAL
  Filled 2023-04-20: qty 2
  Filled 2023-04-20: qty 1

## 2023-04-20 MED ORDER — PROPOFOL 10 MG/ML IV BOLUS
INTRAVENOUS | Status: AC
  Filled 2023-04-20: qty 20

## 2023-04-20 MED ORDER — OXYCODONE HCL 5 MG PO TABS: 5.0000 mg | ORAL_TABLET | Freq: Once | ORAL | Status: DC | PRN

## 2023-04-20 MED ORDER — DOCUSATE SODIUM 100 MG PO CAPS
100.0000 mg | ORAL_CAPSULE | Freq: Two times a day (BID) | ORAL | Status: DC
  Administered 2023-04-20: 100 mg via ORAL
  Filled 2023-04-20 (×2): qty 1

## 2023-04-20 MED ORDER — DEXAMETHASONE SODIUM PHOSPHATE 10 MG/ML IJ SOLN
8.0000 mg | Freq: Once | INTRAMUSCULAR | Status: AC
  Administered 2023-04-20: 10 mg via INTRAVENOUS

## 2023-04-20 MED ORDER — TRAMADOL HCL 50 MG PO TABS: 50.0000 mg | ORAL_TABLET | Freq: Four times a day (QID) | ORAL | Status: DC | PRN

## 2023-04-20 SURGICAL SUPPLY — 41 items
ATTUNE PS FEM LT SZ 5 CEM KNEE (Femur) IMPLANT
ATTUNE PSRP INSR SZ 5 10M KNEE (Insert) IMPLANT
BAG COUNTER SPONGE SURGICOUNT (BAG) IMPLANT
BAG ZIPLOCK 12X15 (MISCELLANEOUS) ×1 IMPLANT
BASE TIBIAL ROT PLAT SZ 5 KNEE (Knees) IMPLANT
BLADE SAG 18X100X1.27 (BLADE) ×1 IMPLANT
BLADE SAW SGTL 11.0X1.19X90.0M (BLADE) ×1 IMPLANT
BNDG ELASTIC 6INX 5YD STR LF (GAUZE/BANDAGES/DRESSINGS) ×1 IMPLANT
BOWL SMART MIX CTS (DISPOSABLE) ×1 IMPLANT
CEMENT HV SMART SET (Cement) ×2 IMPLANT
COVER SURGICAL LIGHT HANDLE (MISCELLANEOUS) ×1 IMPLANT
CUFF TRNQT CYL 34X4.125X (TOURNIQUET CUFF) ×1 IMPLANT
DERMABOND ADVANCED .7 DNX12 (GAUZE/BANDAGES/DRESSINGS) ×1 IMPLANT
DRAPE U-SHAPE 47X51 STRL (DRAPES) ×1 IMPLANT
DRSG AQUACEL AG ADV 3.5X10 (GAUZE/BANDAGES/DRESSINGS) ×1 IMPLANT
DURAPREP 26ML APPLICATOR (WOUND CARE) ×1 IMPLANT
ELECT REM PT RETURN 15FT ADLT (MISCELLANEOUS) ×1 IMPLANT
GLOVE BIOGEL PI IND STRL 7.0 (GLOVE) IMPLANT
GLOVE BIOGEL PI IND STRL 8 (GLOVE) ×1 IMPLANT
GOWN STRL REUS W/ TWL LRG LVL3 (GOWN DISPOSABLE) ×1 IMPLANT
HOLDER FOLEY CATH W/STRAP (MISCELLANEOUS) IMPLANT
IMMOBILIZER KNEE 20 (SOFTGOODS) ×1 IMPLANT
IMMOBILIZER KNEE 20 THIGH 36 (SOFTGOODS) ×1 IMPLANT
KIT TURNOVER KIT A (KITS) IMPLANT
MANIFOLD NEPTUNE II (INSTRUMENTS) ×1 IMPLANT
NS IRRIG 1000ML POUR BTL (IV SOLUTION) ×1 IMPLANT
PACK TOTAL KNEE CUSTOM (KITS) ×1 IMPLANT
PADDING CAST COTTON 6X4 STRL (CAST SUPPLIES) ×2 IMPLANT
PATELLA MEDIAL ATTUN 35MM KNEE (Knees) IMPLANT
PIN STEINMAN FIXATION KNEE (PIN) IMPLANT
PROTECTOR NERVE ULNAR (MISCELLANEOUS) ×1 IMPLANT
SET HNDPC FAN SPRY TIP SCT (DISPOSABLE) ×1 IMPLANT
SUT MNCRL AB 4-0 PS2 18 (SUTURE) ×1 IMPLANT
SUT STRATAFIX 0 PDS 27 VIOLET (SUTURE) ×1 IMPLANT
SUT VIC AB 2-0 CT1 TAPERPNT 27 (SUTURE) ×3 IMPLANT
SUTURE STRATFX 0 PDS 27 VIOLET (SUTURE) ×1 IMPLANT
TIBIAL BASE ROT PLAT SZ 5 KNEE (Knees) ×1 IMPLANT
TRAY FOL W/BAG SLVR 16FR STRL (SET/KITS/TRAYS/PACK) IMPLANT
TUBE SUCTION HIGH CAP CLEAR NV (SUCTIONS) ×1 IMPLANT
WATER STERILE IRR 1000ML POUR (IV SOLUTION) ×2 IMPLANT
WRAP KNEE MAXI GEL POST OP (GAUZE/BANDAGES/DRESSINGS) ×1 IMPLANT

## 2023-04-20 NOTE — Transfer of Care (Signed)
 Immediate Anesthesia Transfer of Care Note  Patient: Beverly Carter  Procedure(s) Performed: ARTHROPLASTY, KNEE, TOTAL (Left: Knee)  Patient Location: PACU  Anesthesia Type:General  Level of Consciousness: awake and drowsy  Airway & Oxygen Therapy: Patient Spontanous Breathing and Patient connected to face mask oxygen  Post-op Assessment: Report given to RN and Post -op Vital signs reviewed and stable  Post vital signs: Reviewed and stable  Last Vitals:  Vitals Value Taken Time  BP    Temp    Pulse    Resp    SpO2      Last Pain:  Vitals:   04/20/23 0835  TempSrc:   PainSc: 10-Worst pain ever         Complications: No notable events documented.

## 2023-04-20 NOTE — Evaluation (Signed)
 Physical Therapy Evaluation Patient Details Name: Beverly Carter MRN: [id number] DOB: 01-12-1946 Today's Date: 04/20/2023  History of Present Illness  78 yo female s/p L TKA on 04/20/23. PMH:CTR, hysterectomy  Clinical Impression  Pt is s/p TKA resulting in the deficits listed below (see PT Problem List).  Pt amb 50' with RW and CGA-min assist. No incr pain with activity. Tol distance well; pt is very motivated and anticipate steady progress in acute setting.  Pt will benefit from acute skilled PT to increase their independence and safety with mobility to allow discharge.          If plan is discharge home, recommend the following: A little help with walking and/or transfers;A little help with bathing/dressing/bathroom;Help with stairs or ramp for entrance;Assist for transportation   Can travel by private vehicle        Equipment Recommendations None recommended by PT  Recommendations for Other Services       Functional Status Assessment Patient has had a recent decline in their functional status and demonstrates the ability to make significant improvements in function in a reasonable and predictable amount of time.     Precautions / Restrictions Precautions Precautions: Fall;Knee Required Braces or Orthoses: Knee Immobilizer - Left Knee Immobilizer - Left: Discontinue once straight leg raise with < 10 degree lag Restrictions Weight Bearing Restrictions Per Provider Order: No Other Position/Activity Restrictions: WBAT      Mobility  Bed Mobility Overal bed mobility: Needs Assistance Bed Mobility: Supine to Sit     Supine to sit: Min assist     General bed mobility comments: assist with LLE, incr time    Transfers Overall transfer level: Needs assistance Equipment used: Rolling walker (2 wheels) Transfers: Sit to/from Stand Sit to Stand: Min assist           General transfer comment: cues for hand placement and LLE position     Ambulation/Gait Ambulation/Gait assistance: Contact guard assist, Min assist Gait Distance (Feet): 50 Feet Assistive device: Rolling walker (2 wheels) Gait Pattern/deviations: Step-to pattern, Decreased stance time - left       General Gait Details: cues for sequence, RW Position  Stairs            Wheelchair Mobility     Tilt Bed    Modified Rankin (Stroke Patients Only)       Balance Overall balance assessment: Mild deficits observed, not formally tested                                           Pertinent Vitals/Pain Pain Assessment Pain Assessment: 0-10 Pain Score: 1  Pain Location: L knee/thigh Pain Descriptors / Indicators: Discomfort Pain Intervention(s): Monitored during session, Premedicated before session, Repositioned    Home Living Family/patient expects to be discharged to:: Private residence Living Arrangements: Alone Available Help at Discharge: Family (dtr coming from [place]) Type of Home: House Home Access: Stairs to enter Entrance Stairs-Rails: Right Entrance Stairs-Number of Steps: 4   Home Layout: One level Home Equipment: Agricultural consultant (2 wheels)      Prior Function Prior Level of Function : Independent/Modified Independent                     Extremity/Trunk Assessment   Upper Extremity Assessment Upper Extremity Assessment: Overall WFL for tasks assessed    Lower Extremity Assessment Lower Extremity Assessment: LLE deficits/detail LLE  Deficits / Details: ankle WFL, knee extension and hip flexion 2+/5, limited by anticipated  post op deficits       Communication   Communication Communication: No apparent difficulties    Cognition Arousal: Alert Behavior During Therapy: WFL for tasks assessed/performed   PT - Cognitive impairments: No apparent impairments                         Following commands: Intact       Cueing       General Comments      Exercises Total Joint  Exercises Ankle Circles/Pumps: AROM, Both, 5 reps Quad Sets: AROM, Both, 5 reps   Assessment/Plan    PT Assessment Patient needs continued PT services  PT Problem List Decreased strength;Decreased range of motion;Decreased activity tolerance;Decreased balance;Decreased knowledge of use of DME;Pain;Decreased mobility       PT Treatment Interventions DME instruction;Gait training;Functional mobility training;Therapeutic activities;Patient/family education;Stair training;Therapeutic exercise    PT Goals (Current goals can be found in the Care Plan section)  Acute Rehab PT Goals Patient Stated Goal: back to IND PT Goal Formulation: With patient Time For Goal Achievement: 04/27/23 Potential to Achieve Goals: Good    Frequency 7X/week     Co-evaluation               AM-PAC PT "6 Clicks" Mobility  Outcome Measure Help needed turning from your back to your side while in a flat bed without using bedrails?: A Little Help needed moving from lying on your back to sitting on the side of a flat bed without using bedrails?: A Little Help needed moving to and from a bed to a chair (including a wheelchair)?: A Little Help needed standing up from a chair using your arms (e.g., wheelchair or bedside chair)?: A Little Help needed to walk in hospital room?: A Little Help needed climbing 3-5 steps with a railing? : A Little 6 Click Score: 18    End of Session Equipment Utilized During Treatment: Gait belt;Left knee immobilizer Activity Tolerance: Patient tolerated treatment well Patient left: in chair;with call bell/phone within reach;with chair alarm set   PT Visit Diagnosis: Other abnormalities of gait and mobility (R26.89)    Time: 5284-1324 PT Time Calculation (min) (ACUTE ONLY): 34 min   Charges:   PT Evaluation $PT Eval Low Complexity: 1 Low PT Treatments $Gait Training: 8-22 mins PT General Charges $$ ACUTE PT VISIT: 1 Visit         Takeo Harts, PT  Acute Rehab Dept  [facility]) [phone]  04/20/2023   [facility] 04/20/2023, 4:40 PM

## 2023-04-20 NOTE — Progress Notes (Signed)
 Orthopedic Tech Progress Note Patient Details:  Beverly Carter October 29, 1945 [id number]  CPM Left Knee CPM Left Knee: On Left Knee Flexion (Degrees): 40 Left Knee Extension (Degrees): 10  Post Interventions Patient Tolerated: Well Instructions Provided: Care of device  [place] A Gerilyn Pilgrim 04/20/2023, 12:19 PM

## 2023-04-20 NOTE — Op Note (Signed)
 OPERATIVE REPORT-TOTAL KNEE ARTHROPLASTY   Pre-operative diagnosis- Osteoarthritis  Left knee(s)  Post-operative diagnosis- Osteoarthritis Left knee(s)  Procedure-  Left  Total Knee Arthroplasty  Surgeon- [name]. Shaniece Bussa, MD  Assistant- [place] Jansky, PA-C   Anesthesia-  GA combined with regional for post-op pain  EBL- 25 ml   Drains None  Tourniquet time-  Total Tourniquet Time Documented: Thigh (Left) - 36 minutes Total: Thigh (Left) - 36 minutes     Complications- None  Condition-PACU - hemodynamically stable.   Brief Clinical Note  Noami Carter is a 78 y.o. year old female with end stage OA of her left knee with progressively worsening pain and dysfunction. She has constant pain, with activity and at rest and significant functional deficits with difficulties even with ADLs. She has had extensive non-op management including analgesics, injections of cortisone and viscosupplements, and home exercise program, but remains in significant pain with significant dysfunction. Radiographs show bone on bone arthritis medial and patellofemoral. She presents now for left Total Knee Arthroplasty.     Procedure in detail---   The patient is brought into the operating room and positioned supine on the operating table. After successful administration of  GA combined with regional for post-op pain,   a tourniquet is placed high on the  Left thigh(s) and the lower extremity is prepped and draped in the usual sterile fashion. Time out is performed by the operating team and then the  Left lower extremity is wrapped in Esmarch, knee flexed and the tourniquet inflated to 300 mmHg.       A midline incision is made with a ten blade through the subcutaneous tissue to the level of the extensor mechanism. A fresh blade is used to make a medial parapatellar arthrotomy. Soft tissue over the proximal medial tibia is subperiosteally elevated to the joint line with a knife and into the  semimembranosus bursa with a Cobb elevator. Soft tissue over the proximal lateral tibia is elevated with attention being paid to avoiding the patellar tendon on the tibial tubercle. The patella is everted, knee flexed 90 degrees and the ACL and PCL are removed. Findings are bone on bone medial and patellofemoral with large global osteophytes        The drill is used to create a starting hole in the distal femur and the canal is thoroughly irrigated with sterile saline to remove the fatty contents. The 5 degree Left  valgus alignment guide is placed into the femoral canal and the distal femoral cutting block is pinned to remove 9 mm off the distal femur. Resection is made with an oscillating saw.      The tibia is subluxed forward and the menisci are removed. The extramedullary alignment guide is placed referencing proximally at the medial aspect of the tibial tubercle and distally along the second metatarsal axis and tibial crest. The block is pinned to remove 2mm off the more deficient medial  side. Resection is made with an oscillating saw. Size 5is the most appropriate size for the tibia and the proximal tibia is prepared with the modular drill and keel punch for that size.      The femoral sizing guide is placed and size 6 is most appropriate. Rotation is marked off the epicondylar axis and confirmed by creating a rectangular flexion gap at 90 degrees. The size 6 cutting block is pinned in this rotation and the anterior, posterior and chamfer cuts are made with the oscillating saw. The intercondylar block is then placed and  that cut is made.      Trial size 5 tibial component, trial size 6 posterior stabilized femur and a 10  mm posterior stabilized rotating platform insert trial is placed. Full extension is achieved with excellent varus/valgus and anterior/posterior balance throughout full range of motion. The patella is everted and thickness measured to be 22  mm. Free hand resection is taken to 12 mm, a  35 template is placed, lug holes are drilled, trial patella is placed, and it tracks normally. Osteophytes are removed off the posterior femur with the trial in place. All trials are removed and the cut bone surfaces prepared with pulsatile lavage. Cement is mixed and once ready for implantation, the size 5 tibial implant, size  6 posterior stabilized femoral component, and the size 35 patella are cemented in place and the patella is held with the clamp. The trial insert is placed and the knee held in full extension. The Exparel (20 ml mixed with 60 ml saline) is injected into the extensor mechanism, posterior capsule, medial and lateral gutters and subcutaneous tissues.  All extruded cement is removed and once the cement is hard the permanent 10 mm posterior stabilized rotating platform insert is placed into the tibial tray.      The wound is copiously irrigated with saline solution and the extensor mechanism closed with # 0 Stratofix suture. The tourniquet is released for a total tourniquet time of 36  minutes. Flexion against gravity is 140 degrees and the patella tracks normally. Subcutaneous tissue is closed with 2.0 vicryl and subcuticular with running 4.0 Monocryl. The incision is cleaned and dried and steri-strips and a bulky sterile dressing are applied. The limb is placed into a knee immobilizer and the patient is awakened and transported to recovery in stable condition.      Please note that a surgical assistant was a medical necessity for this procedure in order to perform it in a safe and expeditious manner. Surgical assistant was necessary to retract the ligaments and vital neurovascular structures to prevent injury to them and also necessary for proper positioning of the limb to allow for anatomic placement of the prosthesis.   [name] [name], MD    04/20/2023, 11:35 AM

## 2023-04-20 NOTE — Discharge Instructions (Signed)
 Beverly Gross, MD Total Joint Specialist [facility] [address], [address] [place], [place] [address] [phone]  TOTAL KNEE REPLACEMENT POSTOPERATIVE DIRECTIONS    Knee Rehabilitation, Guidelines Following Surgery  Results after knee surgery are often greatly improved when you follow the exercise, range of motion and muscle strengthening exercises prescribed by your doctor. Safety measures are also important to protect the knee from further injury. If any of these exercises cause you to have increased pain or swelling in your knee joint, decrease the amount until you are comfortable again and slowly increase them. If you have problems or questions, call your caregiver or physical therapist for advice.   BLOOD CLOT PREVENTION Take 81 mg Aspirin two times a day for three weeks following surgery. Then take an 81 mg Aspirin once a day for three weeks. Then discontinue Aspirin. You may resume your vitamins/supplements upon discharge from the hospital. Do not take any NSAIDs (Advil, Aleve, Ibuprofen, Meloxicam, etc.) for 3 weeks, while taking 81mg  Aspirin twice a day.   HOME CARE INSTRUCTIONS  Remove items at home which could result in a fall. This includes throw rugs or furniture in walking pathways.  ICE to the affected knee as much as tolerated. Icing helps control swelling. If the swelling is well controlled you will be more comfortable and rehab easier. Continue to use ice on the knee for pain and swelling from surgery. You may notice swelling that will progress down to the foot and ankle. This is normal after surgery. Elevate the leg when you are not up walking on it.    Continue to use the breathing machine which will help keep your temperature down. It is common for your temperature to cycle up and down following surgery, especially at night when you are not up moving around and exerting yourself. The breathing machine keeps your lungs expanded and your temperature  down. Do not place pillow under the operative knee, focus on keeping the knee straight while resting  DIET You may resume your previous home diet once you are discharged from the hospital.  DRESSING / WOUND CARE / SHOWERING Keep your bulky bandage on for 2 days. On the third post-operative day you may remove the Ace bandage and gauze. There is a waterproof adhesive bandage on your skin which will stay in place until your first follow-up appointment. Once you remove this you will not need to place another bandage You may begin showering 3 days following surgery, but do not submerge the incision under water.  ACTIVITY For the first 5 days, the key is rest and control of pain and swelling Do your home exercises twice a day starting on post-operative day 3. On the days you go to physical therapy, just do the home exercises once that day. You should rest, ice and elevate the leg for 50 minutes out of every hour. Get up and walk/stretch for 10 minutes per hour. After 5 days you can increase your activity slowly as tolerated. Walk with your walker as instructed. Use the walker until you are comfortable transitioning to a cane. Walk with the cane in the opposite hand of the operative leg. You may discontinue the cane once you are comfortable and walking steadily. Avoid periods of inactivity such as sitting longer than an hour when not asleep. This helps prevent blood clots.  You may discontinue the knee immobilizer once you are able to perform a straight leg raise while lying down. You may resume a sexual relationship in  one month or when given the OK by your doctor.  You may return to work once you are cleared by your doctor.  Do not drive a car for 6 weeks or until released by your surgeon.  Do not drive while taking narcotics.  TED HOSE STOCKINGS Wear the elastic stockings on both legs for three weeks following surgery during the day. You may remove them at night for sleeping.  WEIGHT  BEARING Weight bearing as tolerated with assist device (walker, cane, etc) as directed, use it as long as suggested by your surgeon or therapist, typically at least 4-6 weeks.  POSTOPERATIVE CONSTIPATION PROTOCOL Constipation - defined medically as fewer than three stools per week and severe constipation as less than one stool per week.  One of the most common issues patients have following surgery is constipation.  Even if you have a regular bowel pattern at home, your normal regimen is likely to be disrupted due to multiple reasons following surgery.  Combination of anesthesia, postoperative narcotics, change in appetite and fluid intake all can affect your bowels.  In order to avoid complications following surgery, here are some recommendations in order to help you during your recovery period.  Colace (docusate) - Pick up an over-the-counter form of Colace or another stool softener and take twice a day as long as you are requiring postoperative pain medications.  Take with a full glass of water daily.  If you experience loose stools or diarrhea, hold the colace until you stool forms back up. If your symptoms do not get better within 1 week or if they get worse, check with your doctor. Dulcolax (bisacodyl) - Pick up over-the-counter and take as directed by the product packaging as needed to assist with the movement of your bowels.  Take with a full glass of water.  Use this product as needed if not relieved by Colace only.  MiraLax (polyethylene glycol) - Pick up over-the-counter to have on hand. MiraLax is a solution that will increase the amount of water in your bowels to assist with bowel movements.  Take as directed and can mix with a glass of water, juice, soda, coffee, or tea. Take if you go more than two days without a movement. Do not use MiraLax more than once per day. Call your doctor if you are still constipated or irregular after using this medication for 7 days in a row.  If you continue  to have problems with postoperative constipation, please contact the office for further assistance and recommendations.  If you experience "the worst abdominal pain ever" or develop nausea or vomiting, please contact the office immediatly for further recommendations for treatment.  ITCHING If you experience itching with your medications, try taking only a single pain pill, or even half a pain pill at a time.  You can also use Benadryl over the counter for itching or also to help with sleep.   MEDICATIONS See your medication summary on the "After Visit Summary" that the nursing staff will review with you prior to discharge.  You may have some home medications which will be placed on hold until you complete the course of blood thinner medication.  It is important for you to complete the blood thinner medication as prescribed by your surgeon.  Continue your approved medications as instructed at time of discharge.  PRECAUTIONS If you experience chest pain or shortness of breath - call 911 immediately for transfer to the hospital emergency department.  If you develop a fever greater that  101 F, purulent drainage from wound, increased redness or drainage from wound, foul odor from the wound/dressing, or calf pain - CONTACT YOUR SURGEON.                                                   FOLLOW-UP APPOINTMENTS Make sure you keep all of your appointments after your operation with your surgeon and caregivers. You should call the office at the above phone number and make an appointment for approximately two weeks after the date of your surgery or on the date instructed by your surgeon outlined in the "After Visit Summary".  RANGE OF MOTION AND STRENGTHENING EXERCISES  Rehabilitation of the knee is important following a knee injury or an operation. After just a few days of immobilization, the muscles of the thigh which control the knee become weakened and shrink (atrophy). Knee exercises are designed to build up  the tone and strength of the thigh muscles and to improve knee motion. Often times heat used for twenty to thirty minutes before working out will loosen up your tissues and help with improving the range of motion but do not use heat for the first two weeks following surgery. These exercises can be done on a training (exercise) mat, on the floor, on a table or on a bed. Use what ever works the best and is most comfortable for you Knee exercises include:  Leg Lifts - While your knee is still immobilized in a splint or cast, you can do straight leg raises. Lift the leg to 60 degrees, hold for 3 sec, and slowly lower the leg. Repeat 10-20 times 2-3 times daily. Perform this exercise against resistance later as your knee gets better.  Quad and Hamstring Sets - Tighten up the muscle on the front of the thigh (Quad) and hold for 5-10 sec. Repeat this 10-20 times hourly. Hamstring sets are done by pushing the foot backward against an object and holding for 5-10 sec. Repeat as with quad sets.  Leg Slides: Lying on your back, slowly slide your foot toward your buttocks, bending your knee up off the floor (only go as far as is comfortable). Then slowly slide your foot back down until your leg is flat on the floor again. Angel Wings: Lying on your back spread your legs to the side as far apart as you can without causing discomfort.  A rehabilitation program following serious knee injuries can speed recovery and prevent re-injury in the future due to weakened muscles. Contact your doctor or a physical therapist for more information on knee rehabilitation.   POST-OPERATIVE OPIOID TAPER INSTRUCTIONS: It is important to wean off of your opioid medication as soon as possible. If you do not need pain medication after your surgery it is ok to stop day one. Opioids include: Codeine, Hydrocodone(Norco, Vicodin), Oxycodone(Percocet, oxycontin) and hydromorphone amongst others.  Long term and even short term use of opiods can  cause: Increased pain response Dependence Constipation Depression Respiratory depression And more.  Withdrawal symptoms can include Flu like symptoms Nausea, vomiting And more Techniques to manage these symptoms Hydrate well Eat regular healthy meals Stay active Use relaxation techniques(deep breathing, meditating, yoga) Do Not substitute Alcohol to help with tapering If you have been on opioids for less than two weeks and do not have pain than it is ok to stop all together.  Plan to wean off of opioids This plan should start within one week post op of your joint replacement. Maintain the same interval or time between taking each dose and first decrease the dose.  Cut the total daily intake of opioids by one tablet each day Next start to increase the time between doses. The last dose that should be eliminated is the evening dose.   IF YOU ARE TRANSFERRED TO A SKILLED REHAB FACILITY If the patient is transferred to a skilled rehab facility following release from the hospital, a list of the current medications will be sent to the facility for the patient to continue.  When discharged from the skilled rehab facility, please have the facility set up the patient's Home Health Physical Therapy prior to being released. Also, the skilled facility will be responsible for providing the patient with their medications at time of release from the facility to include their pain medication, the muscle relaxants, and their blood thinner medication. If the patient is still at the rehab facility at time of the two week follow up appointment, the skilled rehab facility will also need to assist the patient in arranging follow up appointment in our office and any transportation needs.  MAKE SURE YOU:  Understand these instructions.  Get help right away if you are not doing well or get worse.   DENTAL ANTIBIOTICS:  In most cases prophylactic antibiotics for Dental procdeures after total joint surgery are  not necessary.  Exceptions are as follows:  1. History of prior total joint infection  2. Severely immunocompromised (Organ Transplant, cancer chemotherapy, Rheumatoid biologic meds such as Humera)  3. Poorly controlled diabetes (A1C &gt; 8.0, blood glucose over 200)  If you have one of these conditions, contact your surgeon for an antibiotic prescription, prior to your dental procedure.    Pick up stool softner and laxative for home use following surgery while on pain medications. Do not submerge incision under water. Please use good hand washing techniques while changing dressing each day. May shower starting three days after surgery. Please use a clean towel to pat the incision dry following showers. Continue to use ice for pain and swelling after surgery. Do not use any lotions or creams on the incision until instructed by your surgeon.

## 2023-04-20 NOTE — Anesthesia Procedure Notes (Signed)
 Anesthesia Regional Block: Adductor canal block   Pre-Anesthetic Checklist: , timeout performed,  Correct Patient, Correct Site, Correct Laterality,  Correct Procedure, Correct Position, site marked,  Risks and benefits discussed,  Surgical consent,  Pre-op evaluation,  At surgeon's request and post-op pain management  Laterality: Left  Prep: chloraprep       Needles:  Injection technique: Single-shot  Needle Type: Stimiplex     Needle Length: 9cm  Needle Gauge: 21     Additional Needles:   Procedures:,,,, ultrasound used (permanent image in chart),,    Narrative:  Start time: 04/20/2023 10:15 AM End time: 04/20/2023 10:20 AM Injection made incrementally with aspirations every 5 mL.  Performed by: Personally  Anesthesiologist: Lowella Curb, MD

## 2023-04-20 NOTE — Interval H&P Note (Signed)
 History and Physical Interval Note:  04/20/2023 8:22 AM  Beverly Carter  has presented today for surgery, with the diagnosis of left knee osteoarthritis.  The various methods of treatment have been discussed with the patient and family. After consideration of risks, benefits and other options for treatment, the patient has consented to  Procedure(s): ARTHROPLASTY, KNEE, TOTAL (Left) as a surgical intervention.  The patient's history has been reviewed, patient examined, no change in status, stable for surgery.  I have reviewed the patient's chart and labs.  Questions were answered to the patient's satisfaction.     Homero Fellers Aziya Arena

## 2023-04-20 NOTE — Anesthesia Preprocedure Evaluation (Signed)
 Anesthesia Evaluation  Patient identified by MRN, date of birth, ID band Patient awake    Reviewed: Allergy & Precautions, H&P , NPO status , Patient's Chart, lab work & pertinent test results  Airway Mallampati: II  TM Distance: >3 FB Neck ROM: Full    Dental no notable dental hx.    Pulmonary neg pulmonary ROS   Pulmonary exam normal breath sounds clear to auscultation       Cardiovascular hypertension, Pt. on medications negative cardio ROS Normal cardiovascular exam Rhythm:Regular Rate:Normal     Neuro/Psych negative neurological ROS  negative psych ROS   GI/Hepatic negative GI ROS, Neg liver ROS,,,  Endo/Other  negative endocrine ROS    Renal/GU negative Renal ROS  negative genitourinary   Musculoskeletal  (+) Arthritis , Osteoarthritis,    Abdominal   Peds negative pediatric ROS (+)  Hematology negative hematology ROS (+)   Anesthesia Other Findings   Reproductive/Obstetrics negative OB ROS                             Anesthesia Physical Anesthesia Plan  ASA: 2  Anesthesia Plan: Spinal   Post-op Pain Management: Regional block*   Induction: Intravenous  PONV Risk Score and Plan: 2 and Ondansetron, Midazolam and Treatment may vary due to age or medical condition  Airway Management Planned: Simple Face Mask  Additional Equipment:   Intra-op Plan:   Post-operative Plan:   Informed Consent: I have reviewed the patients History and Physical, chart, labs and discussed the procedure including the risks, benefits and alternatives for the proposed anesthesia with the patient or authorized representative who has indicated his/her understanding and acceptance.     Dental advisory given  Plan Discussed with: CRNA  Anesthesia Plan Comments:        Anesthesia Quick Evaluation

## 2023-04-20 NOTE — Progress Notes (Signed)
 Orthopedic Tech Progress Note Patient Details:  Beverly Carter [birth year] [id number]  CPM Left Knee CPM Left Knee: Off Left Knee Flexion (Degrees): 40 Left Knee Extension (Degrees): 10  Post Interventions Patient Tolerated: Well Instructions Provided: Care of device  [place] A Gerilyn Pilgrim 04/20/2023, 4:27 PM

## 2023-04-20 NOTE — Anesthesia Postprocedure Evaluation (Signed)
 Anesthesia Post Note  Patient: Beverly Carter  Procedure(s) Performed: ARTHROPLASTY, KNEE, TOTAL (Left: Knee)     Patient location during evaluation: PACU Anesthesia Type: Spinal Level of consciousness: awake and alert Pain management: pain level controlled Vital Signs Assessment: post-procedure vital signs reviewed and stable Respiratory status: spontaneous breathing, nonlabored ventilation and respiratory function stable Cardiovascular status: blood pressure returned to baseline and stable Postop Assessment: no apparent nausea or vomiting Anesthetic complications: no   No notable events documented.  Last Vitals:  Vitals:   04/20/23 1245 04/20/23 1300  BP: (!) 159/77 (!) 156/83  Pulse: 66 (!) 58  Resp: 10 14  Temp:  36.7 C  SpO2: 100% 100%    Last Pain:  Vitals:   04/20/23 1259  TempSrc:   PainSc: Asleep                 Lowella Curb

## 2023-04-21 ENCOUNTER — Encounter (HOSPITAL_COMMUNITY): Payer: Self-pay | Admitting: Orthopedic Surgery

## 2023-04-21 ENCOUNTER — Other Ambulatory Visit (HOSPITAL_COMMUNITY): Payer: Self-pay

## 2023-04-21 DIAGNOSIS — M1712 Unilateral primary osteoarthritis, left knee: Secondary | ICD-10-CM | POA: Diagnosis not present

## 2023-04-21 LAB — BASIC METABOLIC PANEL
Anion gap: 10 (ref 5–15)
BUN: 10 mg/dL (ref 8–23)
CO2: 23 mmol/L (ref 22–32)
Calcium: 8.6 mg/dL — ABNORMAL LOW (ref 8.9–10.3)
Chloride: 104 mmol/L (ref 98–111)
Creatinine, Ser: 0.61 mg/dL (ref 0.44–1.00)
GFR, Estimated: >60 mL/min (ref >60)
Glucose, Bld: 117 mg/dL — ABNORMAL HIGH (ref 70–99)
Potassium: 3.5 mmol/L (ref 3.5–5.1)
Sodium: 137 mmol/L (ref 135–145)

## 2023-04-21 LAB — CBC
HCT: 35.4 % — ABNORMAL LOW (ref 36.0–46.0)
Hemoglobin: 11.5 g/dL — ABNORMAL LOW (ref 12.0–15.0)
MCH: 30.3 pg (ref 26.0–34.0)
MCHC: 32.5 g/dL (ref 30.0–36.0)
MCV: 93.4 fL (ref 80.0–100.0)
Platelets: 191 10*3/uL (ref 150–400)
RBC: 3.79 MIL/uL — ABNORMAL LOW (ref 3.87–5.11)
RDW: 13 % (ref 11.5–15.5)
WBC: 9.3 10*3/uL (ref 4.0–10.5)
nRBC: 0 % (ref 0.0–0.2)

## 2023-04-21 MED ORDER — ASPIRIN 81 MG PO CHEW
81.0000 mg | CHEWABLE_TABLET | Freq: Two times a day (BID) | ORAL | 0 refills | Status: AC
  Filled 2023-04-21: qty 63, 32d supply, fill #0

## 2023-04-21 MED ORDER — ONDANSETRON HCL 4 MG PO TABS
4.0000 mg | ORAL_TABLET | Freq: Four times a day (QID) | ORAL | 0 refills | Status: AC | PRN
  Filled 2023-04-21: qty 20, 5d supply, fill #0

## 2023-04-21 MED ORDER — OXYCODONE HCL 5 MG PO TABS
5.0000 mg | ORAL_TABLET | Freq: Four times a day (QID) | ORAL | 0 refills | Status: AC | PRN
Start: 2023-04-21 — End: ?
  Filled 2023-04-21: qty 42, 6d supply, fill #0

## 2023-04-21 MED ORDER — TRAMADOL HCL 50 MG PO TABS
50.0000 mg | ORAL_TABLET | Freq: Four times a day (QID) | ORAL | 0 refills | Status: AC | PRN
Start: 2023-04-21 — End: ?
  Filled 2023-04-21: qty 40, 5d supply, fill #0

## 2023-04-21 NOTE — Progress Notes (Signed)
 Physical Therapy Treatment Patient Details Name: Beverly Carter MRN: [id number] DOB: April 02, 1945 Today's Date: 04/21/2023   History of Present Illness 78 yo female s/p L TKA on 04/20/23. PMH:CTR, hysterectomy    PT Comments  Pt is making excellent progress; meeting PT goals. Reviewed mobility as below, use of ice and activity progression at home. Dtr present for session. Pt is ready to d/c home with family assisting as needed from PT standpoint. Anticipate continued progress in next venue.     If plan is discharge home, recommend the following: A little help with walking and/or transfers;A little help with bathing/dressing/bathroom;Help with stairs or ramp for entrance;Assist for transportation   Can travel by private vehicle        Equipment Recommendations  None recommended by PT    Recommendations for Other Services       Precautions / Restrictions Precautions Precautions: Fall;Knee Recall of Precautions/Restrictions: Intact Required Braces or Orthoses: Knee Immobilizer - Left Knee Immobilizer - Left: Discontinue once straight leg raise with < 10 degree lag Restrictions Weight Bearing Restrictions Per Provider Order: No Other Position/Activity Restrictions: WBAT     Mobility  Bed Mobility Overal bed mobility: Needs Assistance   General bed mobility comments: in recliner    Transfers Overall transfer level: Needs assistance Equipment used: Rolling walker (2 wheels) Transfers: Sit to/from Stand Sit to Stand: Contact guard assist, Supervision           General transfer comment: cues for hand placement and LLE position    Ambulation/Gait Ambulation/Gait assistance: Contact guard assist, Supervision Gait Distance (Feet): 120 Feet Assistive device: Rolling walker (2 wheels) Gait Pattern/deviations: Step-to pattern, Decreased stance time - left, Step-through pattern       General Gait Details: cues for sequence, RW Position, progression to beginning step through  with improved wt shift to LLE end of distance   Stairs Stairs: Yes Stairs assistance: Contact guard assist Stair Management: One rail Left, One rail Right, Step to pattern, Sideways Number of Stairs: 3 General stair comments: cues for sequence and technique; good stability, no knee buckling. dtr present and able to assist/cue as neded   Wheelchair Mobility     Tilt Bed    Modified Rankin (Stroke Patients Only)       Balance                                            Communication Communication Communication: No apparent difficulties  Cognition Arousal: Alert Behavior During Therapy: WFL for tasks assessed/performed   PT - Cognitive impairments: No apparent impairments                                Cueing    Exercises   General Comments        Pertinent Vitals/Pain Pain Assessment Pain Assessment: Faces Faces Pain Scale: Hurts little more Pain Location: L knee/thigh Pain Descriptors / Indicators: Discomfort, Sore Pain Intervention(s): Limited activity within patient's tolerance, Premedicated before session, Monitored during session, Repositioned    Home Living                          Prior Function            PT Goals (current goals can now be found in the care plan section) Acute  Rehab PT Goals Patient Stated Goal: back to IND PT Goal Formulation: With patient Time For Goal Achievement: 04/27/23 Potential to Achieve Goals: Good Progress towards PT goals: Progressing toward goals    Frequency    7X/week      PT Plan      Co-evaluation              AM-PAC PT "6 Clicks" Mobility   Outcome Measure  Help needed turning from your back to your side while in a flat bed without using bedrails?: A Little Help needed moving from lying on your back to sitting on the side of a flat bed without using bedrails?: A Little Help needed moving to and from a bed to a chair (including a wheelchair)?: A  Little Help needed standing up from a chair using your arms (e.g., wheelchair or bedside chair)?: A Little Help needed to walk in hospital room?: A Little Help needed climbing 3-5 steps with a railing? : A Little 6 Click Score: 18    End of Session Equipment Utilized During Treatment: Gait belt Activity Tolerance: Patient tolerated treatment well Patient left: in chair;with call bell/phone within reach;with chair alarm set;with family/visitor present   PT Visit Diagnosis: Other abnormalities of gait and mobility (R26.89)     Time: 3016-0109 PT Time Calculation (min) (ACUTE ONLY): 24 min  Charges:    $Gait Training: 23-37 mins PT General Charges $$ ACUTE PT VISIT: 1 Visit                     Jarek Longton, PT  Acute Rehab Dept [facility]) [phone]  04/21/2023    [facility] 04/21/2023, 3:31 PM

## 2023-04-21 NOTE — Plan of Care (Signed)
  Problem: Education: Goal: Knowledge of General Education information will improve Description: Including pain rating scale, medication(s)/side effects and non-pharmacologic comfort measures Outcome: Adequate for Discharge   Problem: Health Behavior/Discharge Planning: Goal: Ability to manage health-related needs will improve Outcome: Progressing   Problem: Clinical Measurements: Goal: Ability to maintain clinical measurements within normal limits will improve Outcome: Progressing Goal: Will remain free from infection Outcome: Progressing Goal: Diagnostic test results will improve Outcome: Progressing Goal: Respiratory complications will improve Outcome: Progressing Goal: Cardiovascular complication will be avoided Outcome: Progressing   Problem: Activity: Goal: Risk for activity intolerance will decrease Outcome: Progressing   Problem: Nutrition: Goal: Adequate nutrition will be maintained Outcome: Completed/Met   Problem: Coping: Goal: Level of anxiety will decrease Outcome: Progressing   Problem: Elimination: Goal: Will not experience complications related to bowel motility Outcome: Progressing Goal: Will not experience complications related to urinary retention Outcome: Progressing   Problem: Pain Managment: Goal: General experience of comfort will improve and/or be controlled Outcome: Progressing   Problem: Safety: Goal: Ability to remain free from injury will improve Outcome: Progressing   Problem: Skin Integrity: Goal: Risk for impaired skin integrity will decrease Outcome: Progressing   Problem: Education: Goal: Knowledge of the prescribed therapeutic regimen will improve Outcome: Progressing Goal: Individualized Educational Video(s) Outcome: Completed/Met   Problem: Activity: Goal: Ability to avoid complications of mobility impairment will improve Outcome: Progressing Goal: Range of joint motion will improve Outcome: Adequate for Discharge    Problem: Clinical Measurements: Goal: Postoperative complications will be avoided or minimized Outcome: Progressing   Problem: Pain Management: Goal: Pain level will decrease with appropriate interventions Outcome: Progressing   Problem: Skin Integrity: Goal: Will show signs of wound healing Outcome: Progressing

## 2023-04-21 NOTE — Progress Notes (Signed)
 Physical Therapy Treatment Patient Details Name: Beverly Carter MRN: [id number] DOB: January 10, 1946 Today's Date: 04/21/2023   History of Present Illness 78 yo female s/p L TKA on 04/20/23. PMH:CTR, hysterectomy    PT Comments  Pt progressing very well this session. Incr gait distance with notable improved wt shift/incr stance time on LLE end of distance. A/AROM L knee improving, pain with end range flexion as expected. Will see again in pm and pt will likely be ready to d/c later today     If plan is discharge home, recommend the following: A little help with walking and/or transfers;A little help with bathing/dressing/bathroom;Help with stairs or ramp for entrance;Assist for transportation   Can travel by private vehicle        Equipment Recommendations  None recommended by PT    Recommendations for Other Services       Precautions / Restrictions Precautions Precautions: Fall;Knee Recall of Precautions/Restrictions: Intact Required Braces or Orthoses: Knee Immobilizer - Left Knee Immobilizer - Left: Discontinue once straight leg raise with < 10 degree lag Restrictions Weight Bearing Restrictions Per Provider Order: No Other Position/Activity Restrictions: WBAT     Mobility  Bed Mobility Overal bed mobility: Needs Assistance Bed Mobility: Supine to Sit     Supine to sit: Supervision     General bed mobility comments: cues for RLE position, technique    Transfers Overall transfer level: Needs assistance Equipment used: Rolling walker (2 wheels) Transfers: Sit to/from Stand Sit to Stand: Contact guard assist           General transfer comment: cues for hand placement and LLE position    Ambulation/Gait Ambulation/Gait assistance: Contact guard assist Gait Distance (Feet): 100 Feet Assistive device: Rolling walker (2 wheels) Gait Pattern/deviations: Step-to pattern, Decreased stance time - left       General Gait Details: cues for sequence, RW Position,  progression to beginning step through with improved wt shift to LLE end of distance   Stairs             Wheelchair Mobility     Tilt Bed    Modified Rankin (Stroke Patients Only)       Balance                                            Communication Communication Communication: No apparent difficulties  Cognition Arousal: Alert Behavior During Therapy: WFL for tasks assessed/performed   PT - Cognitive impairments: No apparent impairments                                Cueing    Exercises Total Joint Exercises Ankle Circles/Pumps: AROM, Both, 5 reps Quad Sets: AROM, Both, 5 reps Heel Slides: AAROM, Left, 10 reps Straight Leg Raises: AROM, AAROM, Strengthening, Left, 10 reps    General Comments        Pertinent Vitals/Pain Pain Assessment Pain Assessment: Faces Faces Pain Scale: Hurts little more Pain Location: L knee/thigh Pain Descriptors / Indicators: Discomfort, Sore Pain Intervention(s): Limited activity within patient's tolerance, Monitored during session, Premedicated before session, Repositioned    Home Living                          Prior Function  PT Goals (current goals can now be found in the care plan section) Acute Rehab PT Goals Patient Stated Goal: back to IND PT Goal Formulation: With patient Time For Goal Achievement: 04/27/23 Potential to Achieve Goals: Good Progress towards PT goals: Progressing toward goals    Frequency    7X/week      PT Plan      Co-evaluation              AM-PAC PT "6 Clicks" Mobility   Outcome Measure  Help needed turning from your back to your side while in a flat bed without using bedrails?: A Little Help needed moving from lying on your back to sitting on the side of a flat bed without using bedrails?: A Little Help needed moving to and from a bed to a chair (including a wheelchair)?: A Little Help needed standing up from a chair  using your arms (e.g., wheelchair or bedside chair)?: A Little Help needed to walk in hospital room?: A Little Help needed climbing 3-5 steps with a railing? : A Little 6 Click Score: 18    End of Session Equipment Utilized During Treatment: Gait belt Activity Tolerance: Patient tolerated treatment well Patient left: in chair;with call bell/phone within reach;with chair alarm set   PT Visit Diagnosis: Other abnormalities of gait and mobility (R26.89)     Time: 8657-8469 PT Time Calculation (min) (ACUTE ONLY): 38 min  Charges:    $Gait Training: 8-22 mins $Therapeutic Exercise: 8-22 mins PT General Charges $$ ACUTE PT VISIT: 1 Visit                     Delice Bison, PT  Acute Rehab Dept [facility]) [phone]  04/21/2023    [facility] 04/21/2023, 11:18 AM

## 2023-04-21 NOTE — TOC Transition Note (Signed)
 Transition of Care [facility]) - Discharge Note   Patient Details  Name: Beverly Carter MRN: [id number] Date of Birth: 06-02-1945  [facility]) CM/SW Contact:  Howell Rucks, RN Phone Number: 04/21/2023, 10:44 AM   Clinical Narrative:  Met with pt at bedside to review dc therapy and home equipment needs, pt confirmed OPPT at [facility], has RW, no DME needs. No TOC needs.      Final next level of care: OP Rehab Barriers to Discharge: No Barriers Identified   Patient Goals and CMS Choice Patient states their goals for this hospitalization and ongoing recovery are:: return home          Discharge Placement                       Discharge Plan and Services Additional resources added to the After Visit Summary for                                       Social Drivers of Health (SDOH) Interventions SDOH Screenings   Food Insecurity: No Food Insecurity (04/20/2023)  Housing: Low Risk  (04/20/2023)  Transportation Needs: No Transportation Needs (04/20/2023)  Utilities: Not At Risk (04/20/2023)  Social Connections: Unknown (04/20/2023)  Tobacco Use: Low Risk  (04/20/2023)     Readmission Risk Interventions     No data to display

## 2023-04-21 NOTE — Progress Notes (Signed)
 Subjective: 1 Day Post-Op Procedure(s) (LRB): ARTHROPLASTY, KNEE, TOTAL (Left) Patient reports pain as mild.   Patient seen in rounds by Dr. Lequita Halt. Patient is well, and has had no acute complaints or problems No issues overnight. Denies chest pain, SOB, or calf pain. Foley catheter removed this AM.  We will continue therapy today, ambulated 50' yesterday.   Objective: Vital signs in last 24 hours: Temp:  [97.4 F (36.3 C)-98.5 F (36.9 C)] 98.2 F (36.8 C) (03/18 0812) Pulse Rate:  [58-85] 75 (03/18 0812) Resp:  [10-18] 18 (03/18 0812) BP: (120-182)/(65-95) 142/66 (03/18 0812) SpO2:  [95 %-100 %] 99 % (03/18 0812)  Intake/Output from previous day:  Intake/Output Summary (Last 24 hours) at 04/21/2023 0842 Last data filed at 04/21/2023 6606 Gross per 24 hour  Intake 2584.8 ml  Output 2875 ml  Net -290.2 ml     Intake/Output this shift: No intake/output data recorded.  Labs: Recent Labs    04/21/23 0334  HGB 11.5*   Recent Labs    04/21/23 0334  WBC 9.3  RBC 3.79*  HCT 35.4*  PLT 191   Recent Labs    04/21/23 0334  NA 137  K 3.5  CL 104  CO2 23  BUN 10  CREATININE 0.61  GLUCOSE 117*  CALCIUM 8.6*   No results for input(s): "LABPT", "INR" in the last 72 hours.  Exam: General - Patient is Alert and Oriented Extremity - Neurologically intact Neurovascular intact Sensation intact distally Dorsiflexion/Plantar flexion intact Dressing - dressing C/D/I Motor Function - intact, moving foot and toes well on exam.   Past Medical History:  Diagnosis Date   Allergy    Anemia    Arthritis    Cataracts, bilateral    Diverticulosis of colon (without mention of hemorrhage) 2005   Eczema    Hypertension    Lactose intolerance    per pt   Pneumonia     Assessment/Plan: 1 Day Post-Op Procedure(s) (LRB): ARTHROPLASTY, KNEE, TOTAL (Left) Principal Problem:   OA (osteoarthritis) of knee Active Problems:   Primary osteoarthritis of left  knee  Estimated body mass index is 24.2 kg/m as calculated from the following:   Height as of this encounter: 5\' 6"  (1.676 m).   Weight as of this encounter: 68 kg. Advance diet Up with therapy D/C IV fluids   Patient's anticipated LOS is less than 2 midnights, meeting these requirements: - Lives within 1 hour of care - Has a competent adult at home to recover with post-op - NO history of             - Chronic pain requiring opiods             - Diabetes             - Coronary Artery Disease             - Heart failure             - Heart attack             - Stroke             - DVT/VTE             - Cardiac arrhythmia             - Respiratory Failure/COPD             - Renal failure             - Anemia             -  Advanced Liver disease     DVT Prophylaxis - Aspirin Weight bearing as tolerated. Continue therapy.  Plan is to go Home after hospital stay. Plan for discharge later today if progresses with therapy and meeting goals. Scheduled for OPPT at [facility]. Follow-up in the office in 2 weeks.  The PDMP database was reviewed today prior to any opioid medications being prescribed to this patient.  Arther Abbott, PA-C Orthopedic Surgery [phone] 04/21/2023, 8:42 AM

## 2023-04-21 NOTE — Care Management Obs Status (Signed)
 MEDICARE OBSERVATION STATUS NOTIFICATION   Patient Details  Name: Beverly Carter MRN: [id number] Date of Birth: 1946/01/06   Medicare Observation Status Notification Given:  Yes    Howell Rucks, RN 04/21/2023, 10:19 AM

## 2023-04-22 NOTE — Discharge Summary (Signed)
 Patient ID: Beverly Carter MRN: [id number] DOB/AGE: 1945/03/08 78 y.o.  Admit date: 04/20/2023 Discharge date: 04/21/2023  Admission Diagnoses:  Principal Problem:   OA (osteoarthritis) of knee Active Problems:   Primary osteoarthritis of left knee   Discharge Diagnoses:  Same  Past Medical History:  Diagnosis Date   Allergy    Anemia    Arthritis    Cataracts, bilateral    Diverticulosis of colon (without mention of hemorrhage) 2005   Eczema    Hypertension    Lactose intolerance    per pt   Pneumonia     Surgeries: Procedure(s): ARTHROPLASTY, KNEE, TOTAL on 04/20/2023   Consultants:   Discharged Condition: Improved  Hospital Course: Beverly Carter is an 78 y.o. female who was admitted 04/20/2023 for operative treatment ofOA (osteoarthritis) of knee. Patient has severe unremitting pain that affects sleep, daily activities, and work/hobbies. After pre-op clearance the patient was taken to the operating room on 04/20/2023 and underwent  Procedure(s): ARTHROPLASTY, KNEE, TOTAL.    Patient was given perioperative antibiotics:  Anti-infectives (From admission, onward)    Start     Dose/Rate Route Frequency Ordered Stop   04/20/23 1700  ceFAZolin (ANCEF) IVPB 2g/100 mL premix        2 g 200 mL/hr over 30 Minutes Intravenous Every 6 hours 04/20/23 1329 04/20/23 2312   04/20/23 0815  ceFAZolin (ANCEF) IVPB 2g/100 mL premix        2 g 200 mL/hr over 30 Minutes Intravenous On call to O.R. 04/20/23 0813 04/20/23 1102        Patient was given sequential compression devices, early ambulation, and chemoprophylaxis to prevent DVT.  Patient benefited maximally from hospital stay and there were no complications.    Recent vital signs: Patient Vitals for the past 24 hrs:  BP Temp Pulse Resp SpO2  04/21/23 1409 (!) 149/82 97.9 F (36.6 C) 82 18 95 %     Recent laboratory studies:  Recent Labs    04/21/23 0334  WBC 9.3  HGB 11.5*  HCT 35.4*  PLT 191  NA 137  K 3.5   CL 104  CO2 23  BUN 10  CREATININE 0.61  GLUCOSE 117*  CALCIUM 8.6*     Discharge Medications:   Allergies as of 04/21/2023       Reactions   Egg-derived Products Diarrhea, Other (See Comments)   Stomach pain.   Latex    itching   Other    Cigarette smoke- allergies        Medication List     STOP taking these medications    ibuprofen 200 MG tablet Commonly known as: ADVIL       TAKE these medications    acetaminophen 500 MG tablet Commonly known as: TYLENOL Take 1,000 mg by mouth at bedtime.   Aspirin Low Dose 81 MG chewable tablet Generic drug: aspirin Chew 1 tablet (81 mg total) by mouth 2 (two) times daily for 21 days. Then take one 81 mg aspirin once a day for three weeks. Then discontinue aspirin.   BIOTIN PO Take 1 tablet by mouth daily after supper.   diphenhydrAMINE 25 mg capsule Commonly known as: BENADRYL Take 25 mg by mouth at bedtime.   estradiol 0.5 MG tablet Commonly known as: ESTRACE Take 1 tablet (0.5 mg total) by mouth daily. What changed: when to take this   losartan 25 MG tablet Commonly known as: COZAAR Take 25 mg by mouth every Monday, Wednesday, and Friday.   ondansetron  4 MG tablet Commonly known as: ZOFRAN Take 1 tablet (4 mg total) by mouth every 6 (six) hours as needed for nausea.   oxyCODONE 5 MG immediate release tablet Commonly known as: Oxy IR/ROXICODONE Take 1-2 tablets (5-10 mg total) by mouth every 6 (six) hours as needed for severe pain (pain score 7-10).   Prenatal 27-1 MG Tabs Take 1 tablet by mouth daily after supper.   RED YEAST RICE PO Take 1 capsule by mouth daily after supper.   traMADol 50 MG tablet Commonly known as: ULTRAM Take 1-2 tablets (50-100 mg total) by mouth every 6 (six) hours as needed for moderate pain (pain score 4-6).   vitamin C 1000 MG tablet Take 1,000 mg by mouth daily after supper.               Discharge Care Instructions  (From admission, onward)            Start     Ordered   04/21/23 0000  Weight bearing as tolerated        04/21/23 0844   04/21/23 0000  Change dressing       Comments: You may remove the bulky bandage (ACE wrap and gauze) two days after surgery. You will have an adhesive waterproof bandage underneath. Leave this in place until your first follow-up appointment.   04/21/23 0844            Diagnostic Studies: No results found.  Disposition: Discharge disposition: 01-Home or Self Care       Discharge Instructions     Call MD / Call 911   Complete by: As directed    If you experience chest pain or shortness of breath, CALL 911 and be transported to the hospital emergency room.  If you develope a fever above 101 F, pus (white drainage) or increased drainage or redness at the wound, or calf pain, call your surgeon's office.   Change dressing   Complete by: As directed    You may remove the bulky bandage (ACE wrap and gauze) two days after surgery. You will have an adhesive waterproof bandage underneath. Leave this in place until your first follow-up appointment.   Constipation Prevention   Complete by: As directed    Drink plenty of fluids.  Prune juice may be helpful.  You may use a stool softener, such as Colace (over the counter) 100 mg twice a day.  Use MiraLax (over the counter) for constipation as needed.   Diet - low sodium heart healthy   Complete by: As directed    Do not put a pillow under the knee. Place it under the heel.   Complete by: As directed    Driving restrictions   Complete by: As directed    No driving for two weeks   Post-operative opioid taper instructions:   Complete by: As directed    POST-OPERATIVE OPIOID TAPER INSTRUCTIONS: It is important to wean off of your opioid medication as soon as possible. If you do not need pain medication after your surgery it is ok to stop day one. Opioids include: Codeine, Hydrocodone(Norco, Vicodin), Oxycodone(Percocet, oxycontin) and hydromorphone amongst  others.  Long term and even short term use of opiods can cause: Increased pain response Dependence Constipation Depression Respiratory depression And more.  Withdrawal symptoms can include Flu like symptoms Nausea, vomiting And more Techniques to manage these symptoms Hydrate well Eat regular healthy meals Stay active Use relaxation techniques(deep breathing, meditating, yoga) Do Not substitute Alcohol to  help with tapering If you have been on opioids for less than two weeks and do not have pain than it is ok to stop all together.  Plan to wean off of opioids This plan should start within one week post op of your joint replacement. Maintain the same interval or time between taking each dose and first decrease the dose.  Cut the total daily intake of opioids by one tablet each day Next start to increase the time between doses. The last dose that should be eliminated is the evening dose.      TED hose   Complete by: As directed    Use stockings (TED hose) for three weeks on both leg(s).  You may remove them at night for sleeping.   Weight bearing as tolerated   Complete by: As directed         Follow-up Information     Aluisio, Homero Fellers, MD Follow up in 2 week(s).   Specialty: Orthopedic Surgery Contact information: [address] [place] [address] [phone]                  Signed: Arther Abbott 04/22/2023, 10:01 AM

## 2024-01-02 ENCOUNTER — Encounter: Payer: Self-pay | Admitting: Internal Medicine

## 2024-01-30 ENCOUNTER — Ambulatory Visit
Admission: EM | Admit: 2024-01-30 | Discharge: 2024-01-30 | Disposition: A | Discharge status: Home / Self Care | Payer: Medicare (Managed Care) | Attending: Family Medicine | Admitting: Family Medicine

## 2024-01-30 ENCOUNTER — Encounter: Payer: Self-pay | Admitting: Emergency Medicine

## 2024-01-30 DIAGNOSIS — R051 Acute cough: Secondary | ICD-10-CM | POA: Diagnosis not present

## 2024-01-30 DIAGNOSIS — B349 Viral infection, unspecified: Secondary | ICD-10-CM

## 2024-01-30 LAB — POC COVID19/FLU A&B COMBO
Covid Antigen, POC: NEGATIVE
Influenza A Antigen, POC: NEGATIVE
Influenza B Antigen, POC: NEGATIVE

## 2024-01-30 NOTE — Discharge Instructions (Signed)
 You tested negative for COVID and flu.  Please treat your symptoms with over the counter cough medication, tylenol  or ibuprofen , humidifier, and rest. Viral illnesses can last 7-14 days. Please follow up with your PCP if your symptoms are not improving. Please go to the ER for any worsening symptoms. This includes but is not limited to fever you can not control with tylenol  or ibuprofen , you are not able to stay hydrated, you have shortness of breath or chest pain.  Thank you for choosing [facility] for your healthcare needs. I hope you feel better soon!

## 2024-01-30 NOTE — ED Triage Notes (Signed)
 Pt reports productive cough, sore throat, and nasal congestion x 3 days. No med use for symptoms. No known sick contacts. Denies fevers at home. Pt reports she was very sick a few weeks ago and treated with amoxicillin  and prednisone. Those symptoms had resolved for at least a few days prior to this starting.

## 2024-01-30 NOTE — ED Provider Notes (Signed)
 " [facility]    CSN: [id number] Arrival date & time: 01/30/24  1138      History   Chief Complaint Chief Complaint  Patient presents with   Cough   Sore Throat    HPI Beverly Carter is a 78 y.o. female  presents for evaluation of URI symptoms for 2-3 days. Patient reports associated symptoms of cough, congestion, sore throat. Denies N/V/D, ears, ear pain, body aches, shortness of breath. Patient does not have a hx of asthma. Patient is not an active smoker.   Reports no known sick contacts.  Pt has no other concerns at this time.    Cough Associated symptoms: sore throat   Sore Throat    Past Medical History:  Diagnosis Date   Allergy    Anemia    Arthritis    Cataracts, bilateral    Diverticulosis of colon (without mention of hemorrhage) 2005   Eczema    Hypertension    Lactose intolerance    per pt   Pneumonia     Patient Active Problem List   Diagnosis Date Noted   OA (osteoarthritis) of knee 04/20/2023   Primary osteoarthritis of left knee 04/20/2023   Stiffness of left knee 03/25/2023   Cervical spondylosis 11/30/2022   Cervicalgia 11/12/2022   Cervical radiculopathy 10/28/2022   Pain in right shoulder 10/21/2022   Hair loss 12/31/2021   Chronic diarrhea 11/19/2021   Flatulence 03/18/2021   Diarrhea due to malabsorption 03/18/2021   Pain in right knee 05/22/2020   Unilateral primary osteoarthritis, left knee 05/22/2020   Unilateral primary osteoarthritis, right knee 05/22/2020   Pain in left shoulder 01/02/2020   Pure hypercholesterolemia 03/06/2017   Hormone replacement therapy (HRT) 03/06/2017   Arthritis of carpometacarpal (CMC) joints of both thumbs 03/20/2016   Bilateral carpal tunnel syndrome 03/20/2016   Left hand pain 03/20/2016   Vulvar atrophy 08/03/2014   DOE (dyspnea on exertion) 06/27/2014   Chest pain 06/27/2014   Tear of meniscus of knee 12/13/2013   Diarrhea 05/23/2013   Colon cancer screening 05/23/2013    Hyperlipidemia 03/09/2013   Atypical nevus of back 09/12/2012   Contact dermatitis 08/30/2012   Allergy    Eczema    Lactose intolerance    Postmenopausal HRT (hormone replacement therapy) 06/01/2012    Past Surgical History:  Procedure Laterality Date   CARPAL TUNNEL RELEASE Right    COLONOSCOPY     thumb arthroplasty Bilateral    TOTAL KNEE ARTHROPLASTY Left 04/20/2023   Procedure: ARTHROPLASTY, KNEE, TOTAL;  Surgeon: Melodi Lerner, MD;  Location: [facility];  Service: Orthopedics;  Laterality: Left;   VAGINAL HYSTERECTOMY      OB History   No obstetric history on file.      Home Medications    Prior to Admission medications  Medication Sig Start Date End Date Taking? Authorizing Provider  acetaminophen  (TYLENOL ) 500 MG tablet Take 1,000 mg by mouth at bedtime.   Yes [provider]  amoxicillin  (AMOXIL ) 500 MG tablet Take 500 mg by mouth every 8 (eight) hours. 01/12/24  Yes [provider]  BIOTIN PO Take 1 tablet by mouth daily after supper.   Yes [provider]  estradiol  (ESTRACE ) 0.5 MG tablet Take 1 tablet (0.5 mg total) by mouth daily. Patient taking differently: Take 0.5 mg by mouth every Monday, Wednesday, and Friday. 02/27/17  Yes Prentiss Frieze, DO  losartan  (COZAAR ) 25 MG tablet Take 25 mg by mouth every Monday, Wednesday, and Friday. 05/07/21  Yes [provider]  predniSONE (DELTASONE) 10 MG tablet Take by mouth. 01/12/24  Yes [provider]  Prenatal 27-1 MG TABS Take 1 tablet by mouth daily after supper. 08/06/21  Yes [provider]  Red Yeast Rice Extract (RED YEAST RICE PO) Take 1 capsule by mouth daily after supper.   Yes [provider]  Ascorbic Acid (VITAMIN C) 1000 MG tablet Take 1,000 mg by mouth daily after supper. Patient not taking: Reported on 01/30/2024    [provider]  baclofen (LIORESAL) 10 MG tablet TAKE 1/2 (ONE-HALF) TABLET BY MOUTH THREE TIMES DAILY AS NEEDED    [provider]  cephALEXin (KEFLEX) 250 MG capsule Take 1 capsule by mouth 3 (three) times daily. Patient not taking: Reported on 01/30/2024    [provider]  cyclobenzaprine (FLEXERIL) 10 MG tablet Take 1 tablet by mouth every 8 (eight) hours as needed. Patient not taking: Reported on 01/30/2024    [provider]  desoximetasone (TOPICORT) 0.05 % cream Apply topically. Patient not taking: Reported on 01/30/2024    [provider]  diphenhydrAMINE  (BENADRYL ) 25 mg capsule Take 25 mg by mouth at bedtime. Patient not taking: Reported on 01/30/2024    [provider]  estradiol  (ESTRACE ) 0.5 MG tablet Take by mouth.    [provider]  HYDROcodone-acetaminophen  (NORCO/VICODIN) 5-325 MG tablet TAKE 1 TABLET BY MOUTH EVERY 4 - 8 HOURS AS NEEDED Patient not taking: Reported on 01/30/2024    [provider]  ondansetron  (ZOFRAN ) 4 MG tablet Take 1 tablet (4 mg total) by mouth every 6 (six) hours as needed for nausea. Patient not taking: Reported on 01/30/2024 04/21/23   Edmisten, Roxie L, PA  oxyCODONE  (OXY IR/ROXICODONE ) 5 MG immediate release tablet Take 1-2 tablets (5-10 mg total) by mouth every 6 (six) hours as needed for severe pain (pain score 7-10). Patient not taking: Reported on 01/30/2024 04/21/23   Edmisten, Roxie CROME, PA  traMADol  (ULTRAM ) 50 MG tablet Take 1-2 tablets (50-100 mg total) by mouth every 6 (six) hours as needed for moderate pain (pain score 4-6). Patient not taking: Reported on 01/30/2024 04/21/23   Reena Roxie CROME, PA    Family History Family History  Problem Relation Age of Onset   Heart disease Mother 16       MI   Hypertension Mother    Dementia Father    Colon cancer Neg Hx    Stomach cancer Neg Hx     Social History Social History[1]   Allergies   Egg protein-containing drug products, Latex, and Other   Review of Systems Review of Systems  HENT:  Positive for congestion and sore throat.    Respiratory:  Positive for cough.      Physical Exam Triage Vital Signs ED Triage Vitals [01/30/24 1343]  Encounter Vitals Group     BP (!) 154/90     Girls Systolic BP Percentile      Girls Diastolic BP Percentile      Boys Systolic BP Percentile      Boys Diastolic BP Percentile      Pulse Rate 74     Resp 18     Temp 98.5 F (36.9 C)     Temp Source Oral     SpO2 94 %     Weight      Height      Head Circumference      Peak Flow      Pain Score 0  Pain Loc      Pain Education      Exclude from Growth Chart    No data found.  Updated Vital Signs BP (!) 154/90 (BP Location: Left Arm)   Pulse 74   Temp 98.5 F (36.9 C) (Oral)   Resp 18   SpO2 94%   Visual Acuity Right Eye Distance:   Left Eye Distance:   Bilateral Distance:    Right Eye Near:   Left Eye Near:    Bilateral Near:     Physical Exam Vitals and nursing note reviewed.  Constitutional:      General: She is not in acute distress.    Appearance: She is well-developed. She is not ill-appearing.  HENT:     Head: Normocephalic and atraumatic.     Right Ear: Tympanic membrane and ear canal normal.     Left Ear: Tympanic membrane and ear canal normal.     Nose: Congestion present.     Mouth/Throat:     Mouth: Mucous membranes are moist.     Pharynx: Oropharynx is clear. Uvula midline. Posterior oropharyngeal erythema present.     Tonsils: No tonsillar exudate or tonsillar abscesses.  Eyes:     Conjunctiva/sclera: Conjunctivae normal.     Pupils: Pupils are equal, round, and reactive to light.  Cardiovascular:     Rate and Rhythm: Normal rate and regular rhythm.     Heart sounds: Normal heart sounds.  Pulmonary:     Effort: Pulmonary effort is normal.     Breath sounds: Normal breath sounds.  Musculoskeletal:     Cervical back: Normal range of motion and neck supple.  Lymphadenopathy:     Cervical: No cervical adenopathy.  Skin:    General: Skin is warm and dry.  Neurological:      General: No focal deficit present.     Mental Status: She is alert and oriented to person, place, and time.  Psychiatric:        Mood and Affect: Mood normal.        Behavior: Behavior normal.      UC Treatments / Results  Labs (all labs ordered are listed, but only abnormal results are displayed) Labs Reviewed  POC COVID19/FLU A&B COMBO    EKG   Radiology No results found.  Procedures Procedures (including critical care time)  Medications Ordered in UC Medications - No data to display  Initial Impression / Assessment and Plan / UC Course  I have reviewed the triage vital signs and the nursing notes.  Pertinent labs & imaging results that were available during my care of the patient were reviewed by me and considered in my medical decision making (see chart for details).     Reviewed exam and symptoms with patient.  No red flags.  Negative COVID and flu testing.  Discussed viral illness and symptomatic treatment.  Advised PCP follow-up if symptoms do not improve.  ER precautions reviewed. Final Clinical Impressions(s) / UC Diagnoses   Final diagnoses:  Acute cough  Viral illness     Discharge Instructions      You tested negative for COVID and flu.  Please treat your symptoms with over the counter cough medication, tylenol  or ibuprofen, humidifier, and rest. Viral illnesses can last 7-14 days. Please follow up with your PCP if your symptoms are not improving. Please go to the ER for any worsening symptoms. This includes but is not limited to fever you can not control with tylenol  or ibuprofen, you  are not able to stay hydrated, you have shortness of breath or chest pain.  Thank you for choosing  for your healthcare needs. I hope you feel better soon!      ED Prescriptions   None    PDMP not reviewed this encounter.     [1]  Social History Tobacco Use   Smoking status: Never   Smokeless tobacco: Never  Vaping Use   Vaping status: Never  Used  Substance Use Topics   Alcohol use: No   Drug use: No     Loreda Myla SAUNDERS, NP 01/30/24 1422  "

## 2024-02-02 ENCOUNTER — Other Ambulatory Visit: Payer: Self-pay | Admitting: Family Medicine

## 2024-02-02 ENCOUNTER — Ambulatory Visit
Admission: RE | Admit: 2024-02-02 | Discharge: 2024-02-02 | Disposition: A | Discharge status: Home / Self Care | Payer: Medicare (Managed Care) | Source: Ambulatory Visit | Attending: Family Medicine | Admitting: Family Medicine

## 2024-02-02 DIAGNOSIS — R058 Other specified cough: Secondary | ICD-10-CM
